# Patient Record
Sex: Female | Born: 1980 | Race: White | Hispanic: No | Marital: Married | Smoking: Current some day smoker
Health system: Southern US, Community
[De-identification: ages and names within clinical notes are randomized; demographics above are authoritative.]

## PROBLEM LIST (undated history)

## (undated) DIAGNOSIS — F32A Depression, unspecified: Secondary | ICD-10-CM

## (undated) DIAGNOSIS — F329 Major depressive disorder, single episode, unspecified: Secondary | ICD-10-CM

## (undated) HISTORY — DX: Depression, unspecified: F32.A

---
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
## 2003-09-06 ENCOUNTER — Emergency Department (HOSPITAL_COMMUNITY): Admission: AD | Admit: 2003-09-06 | Discharge: 2003-09-06 | Payer: Self-pay | Admitting: Family Medicine

## 2005-03-25 ENCOUNTER — Emergency Department: Payer: Self-pay | Admitting: Emergency Medicine

## 2006-09-13 ENCOUNTER — Emergency Department: Payer: Self-pay | Admitting: Emergency Medicine

## 2007-02-14 ENCOUNTER — Emergency Department: Payer: Self-pay | Admitting: Emergency Medicine

## 2007-07-14 ENCOUNTER — Emergency Department: Payer: Self-pay | Admitting: Emergency Medicine

## 2011-12-22 ENCOUNTER — Emergency Department: Payer: Self-pay | Admitting: *Deleted

## 2011-12-22 LAB — CBC WITH DIFFERENTIAL/PLATELET
Basophil #: 0 10*3/uL (ref 0.0–0.1)
Eosinophil %: 1.3 %
HCT: 38.5 % (ref 35.0–47.0)
HGB: 12.5 g/dL (ref 12.0–16.0)
Lymphocyte #: 2.4 10*3/uL (ref 1.0–3.6)
MCH: 30.5 pg (ref 26.0–34.0)
MCHC: 32.6 g/dL (ref 32.0–36.0)
MCV: 94 fL (ref 80–100)
Monocyte #: 0.4 x10 3/mm (ref 0.2–0.9)
Monocyte %: 5.5 %
Neutrophil %: 59 %
RBC: 4.11 10*6/uL (ref 3.80–5.20)
RDW: 12.4 % (ref 11.5–14.5)

## 2011-12-22 LAB — BASIC METABOLIC PANEL
Anion Gap: 9 (ref 7–16)
Chloride: 103 mmol/L (ref 98–107)
Co2: 27 mmol/L (ref 21–32)
EGFR (African American): >60
EGFR (Non-African Amer.): >60
Glucose: 104 mg/dL — ABNORMAL HIGH (ref 65–99)
Osmolality: 276 (ref 275–301)
Potassium: 3.6 mmol/L (ref 3.5–5.1)

## 2011-12-22 LAB — CK TOTAL AND CKMB (NOT AT ARMC)
CK, Total: 108 U/L (ref 21–215)
CK-MB: <0.5 ng/mL — ABNORMAL LOW (ref 0.5–3.6)

## 2011-12-22 LAB — TROPONIN I: Troponin-I: <0.02 ng/mL

## 2019-01-28 ENCOUNTER — Encounter: Payer: Self-pay | Admitting: Family Medicine

## 2019-01-28 ENCOUNTER — Other Ambulatory Visit: Payer: Self-pay

## 2019-01-28 ENCOUNTER — Ambulatory Visit (INDEPENDENT_AMBULATORY_CARE_PROVIDER_SITE_OTHER): Payer: Self-pay | Admitting: Family Medicine

## 2019-01-28 VITALS — BP 107/76 | HR 58

## 2019-01-28 DIAGNOSIS — R635 Abnormal weight gain: Secondary | ICD-10-CM

## 2019-01-28 DIAGNOSIS — F330 Major depressive disorder, recurrent, mild: Secondary | ICD-10-CM

## 2019-01-28 DIAGNOSIS — Z7689 Persons encountering health services in other specified circumstances: Secondary | ICD-10-CM

## 2019-01-28 MED ORDER — BUPROPION HCL ER (XL) 150 MG PO TB24: 150.0000 mg | ORAL_TABLET | Freq: Every day | ORAL | 0 refills | Status: DC

## 2019-01-28 MED ORDER — NORGESTREL-ETHINYL ESTRADIOL 0.3-30 MG-MCG PO TABS: 1.0000 | ORAL_TABLET | Freq: Every day | ORAL | 11 refills | Status: DC

## 2019-01-28 NOTE — Progress Notes (Signed)
BP 107/76 Comment: pt reported- virtual visit  Pulse (!) 58 Comment: pt reported- virtual visit   Subjective:    Patient ID: Tammy Harper, female    DOB: [birth year], 38 y.o.   MRN: [id number]  HPI: Tammy Harper is a 38 y.o. female  Chief Complaint  Patient presents with  . Establish Care    pt has records to bring   . Depression    pt states her medication has made her gain weight    . This visit was completed via WebEx due to the restrictions of the COVID-19 pandemic. All issues as above were discussed and addressed. Physical exam was done as above through visual confirmation on WebEx. If it was felt that the patient should be evaluated in the office, they were directed there. The patient verbally consented to this visit. . Location of the patient: home . Location of the provider: home . Those involved with this call:  . Provider: Merrie Roof, PA-C . CMA: Yvonna Alanis, [place] . Front Desk/Registration: Jill Side  . Time spent on call: 20 minutes with patient face to face via video conference. More than 50% of this time was spent in counseling and coordination of care. 10 minutes total spent in review of patient's record and preparation of their chart. I verified patient identity using two factors (patient name and date of birth). Patient consents verbally to being seen via telemedicine visit today.   Here today to establish care.   Concerned about weight gain. Has typically been in the 130 lb range, now in the mid 150s since getting on the celexa about 2 years ago. Feels an increased appetite since starting the medication. Sometimes gets irritable with family members but otherwise feels it works ok for her. Denies SI/HI, severe mood swings, sleep issues.   Only other medication she takes is birth control pills, which she states she does well on. Needing a refill.   Depression screen [facility] 2/9 01/28/2019  Decreased Interest 0  Down, Depressed, Hopeless 0  PHQ -  2 Score 0  Altered sleeping 0  Tired, decreased energy 0  Change in appetite 0  Feeling bad or failure about yourself  0  Trouble concentrating 0  Moving slowly or fidgety/restless 0  Suicidal thoughts 0  PHQ-9 Score 0  Difficult doing work/chores Not difficult at all   GAD 7 : Generalized Anxiety Score 01/28/2019  Nervous, Anxious, on Edge 0  Control/stop worrying 0  Worry too much - different things 0  Trouble relaxing 0  Restless 0  Easily annoyed or irritable 0  Afraid - awful might happen 0  Total GAD 7 Score 0  Anxiety Difficulty Not difficult at all     Relevant past medical, surgical, family and social history reviewed and updated as indicated. Interim medical history since our last visit reviewed. Allergies and medications reviewed and updated.  Review of Systems  Per HPI unless specifically indicated above     Objective:    BP 107/76 Comment: pt reported- virtual visit  Pulse (!) 58 Comment: pt reported- virtual visit  Wt Readings from Last 3 Encounters:  No data found for Wt    Physical Exam Vitals signs and nursing note reviewed.  Constitutional:      General: She is not in acute distress.    Appearance: Normal appearance.  HENT:     Head: Atraumatic.     Right Ear: External ear normal.     Left Ear: External ear normal.  Nose: Nose normal. No congestion.     Mouth/Throat:     Mouth: Mucous membranes are moist.     Pharynx: Oropharynx is clear. No posterior oropharyngeal erythema.  Eyes:     Extraocular Movements: Extraocular movements intact.     Conjunctiva/sclera: Conjunctivae normal.  Neck:     Musculoskeletal: Normal range of motion.  Cardiovascular:     Comments: Unable to assess via virtual visit Pulmonary:     Effort: Pulmonary effort is normal. No respiratory distress.  Musculoskeletal: Normal range of motion.  Skin:    General: Skin is dry.     Findings: No erythema.  Neurological:     Mental Status: She is alert and oriented  to person, place, and time.  Psychiatric:        Mood and Affect: Mood normal.        Thought Content: Thought content normal.        Judgment: Judgment normal.     No results found for this or any previous visit.    Assessment & Plan:   Problem List Items Addressed This Visit      Other   Depression - Primary    Will switch from celexa to wellbutrin due to appetite increase/weight gain with celexa. Recheck in 1 month.       Relevant Medications   buPROPion (WELLBUTRIN XL) 150 MG 24 hr tablet    Other Visit Diagnoses    Encounter to establish care       Weight gain       Will switch to wellbutrin for depression and work hard on lifestyle modifications. Monitor for benefit, recheck in 1 month       Follow up plan: Return in about 4 weeks (around 02/25/2019) for CPE, mood f/u.

## 2019-02-02 DIAGNOSIS — F32A Depression, unspecified: Secondary | ICD-10-CM | POA: Insufficient documentation

## 2019-02-02 DIAGNOSIS — F329 Major depressive disorder, single episode, unspecified: Secondary | ICD-10-CM | POA: Insufficient documentation

## 2019-02-02 NOTE — Assessment & Plan Note (Signed)
Will switch from celexa to wellbutrin due to appetite increase/weight gain with celexa. Recheck in 1 month.

## 2019-02-18 ENCOUNTER — Telehealth: Payer: Self-pay | Admitting: Nurse Practitioner

## 2019-02-18 NOTE — Telephone Encounter (Signed)
Not patient of this provider.  Appears to have seen Apolonio Schneiders 01/08/19.  Thanks.

## 2019-02-18 NOTE — Telephone Encounter (Signed)
Copied from [place] [phone]. Topic: Quick [employer] and exercise psychologist Patient (Clinic Use ONLY) >> Feb 03, 2019  9:45 AM Amada Kingfisher, CMA wrote: Reason for CRM: Called pt to schedule CPE/Mood F/u for 1 month out. >> Feb 18, 2019 12:47 PM Amada Kingfisher, CMA wrote: Unable to leave vm due to full mailbox. Will generate letter and mail. Will route to provider as FYI.  >> Feb 16, 2019 11:21 AM Amada Kingfisher, CMA wrote: Left message on machine for pt to return call to the office.  >> Feb 04, 2019 10:16 AM Gerda Diss A, CMA wrote: VM box was full unable to leave vm.

## 2019-03-03 ENCOUNTER — Telehealth: Payer: Self-pay | Admitting: Family Medicine

## 2019-03-03 MED ORDER — BUPROPION HCL ER (XL) 150 MG PO TB24: 150.0000 mg | ORAL_TABLET | Freq: Every day | ORAL | 0 refills | Status: DC

## 2019-03-03 NOTE — Telephone Encounter (Signed)
Pt.notified

## 2019-03-03 NOTE — Telephone Encounter (Signed)
Pt called in to get a refill on medications. Scheduled virtual appt for tomorrow. Please advise.

## 2019-03-03 NOTE — Telephone Encounter (Signed)
Will refill to bridge and discuss at upcoming OV

## 2019-03-04 ENCOUNTER — Other Ambulatory Visit: Payer: Self-pay

## 2019-03-04 ENCOUNTER — Encounter: Payer: Self-pay | Admitting: Family Medicine

## 2019-03-04 ENCOUNTER — Ambulatory Visit (INDEPENDENT_AMBULATORY_CARE_PROVIDER_SITE_OTHER): Payer: Self-pay | Admitting: Family Medicine

## 2019-03-04 VITALS — Ht 64.0 in | Wt 152.0 lb

## 2019-03-04 DIAGNOSIS — F330 Major depressive disorder, recurrent, mild: Secondary | ICD-10-CM

## 2019-03-04 MED ORDER — CITALOPRAM HYDROBROMIDE 10 MG PO TABS: 10.0000 mg | ORAL_TABLET | Freq: Every day | ORAL | 1 refills | Status: DC

## 2019-03-04 MED ORDER — BUPROPION HCL ER (XL) 150 MG PO TB24: 150.0000 mg | ORAL_TABLET | Freq: Every day | ORAL | 1 refills | Status: DC

## 2019-03-04 NOTE — Progress Notes (Signed)
Ht 5\' 4"  (1.626 m)   Wt 152 lb (68.9 kg)   BMI 26.09 kg/m    Subjective:    Patient ID: Tammy Harper, female    DOB: [birth year], 38 y.o.   MRN: [id number]  HPI: Tammy Harper is a 38 y.o. female  Chief Complaint  Patient presents with  . Depression    wellbutrin refill    . This visit was completed via WebEx due to the restrictions of the COVID-19 pandemic. All issues as above were discussed and addressed. Physical exam was done as above through visual confirmation on WebEx. If it was felt that the patient should be evaluated in the office, they were directed there. The patient verbally consented to this visit. . Location of the patient: home . Location of the provider: work . Those involved with this call:  . Provider: Merrie Roof, PA-C . CMA: Lesle Chris, [place] . Front Desk/Registration: Jill Side  . Time spent on call: 15 minutes with patient face to face via video conference. More than 50% of this time was spent in counseling and coordination of care. 5 minutes total spent in review of patient's record and preparation of their chart. I verified patient identity using two factors (patient name and date of birth). Patient consents verbally to being seen via telemedicine visit today.   Here today for 1 month mood f/u after starting wellbutrin. Tried coming off the celexa when she switched over to the wellbutrin but felt like she needed to stay on it to some degree given to help with moods. She is currently taking half tab of celexa and the wellbutrin together. Feels like she's more energetic than with the plain celexa and that her moods are much improved on this regimen. Denies SI/HI, sleep, appetite issues.   Depression screen [facility] 2/9 03/04/2019 01/28/2019  Decreased Interest 0 0  Down, Depressed, Hopeless 0 0  PHQ - 2 Score 0 0  Altered sleeping 0 0  Tired, decreased energy 0 0  Change in appetite 0 0  Feeling bad or failure about yourself  0 0  Trouble  concentrating 0 0  Moving slowly or fidgety/restless 0 0  Suicidal thoughts 0 0  PHQ-9 Score 0 0  Difficult doing work/chores - Not difficult at all   GAD 7 : Generalized Anxiety Score 01/28/2019  Nervous, Anxious, on Edge 0  Control/stop worrying 0  Worry too much - different things 0  Trouble relaxing 0  Restless 0  Easily annoyed or irritable 0  Afraid - awful might happen 0  Total GAD 7 Score 0  Anxiety Difficulty Not difficult at all   Relevant past medical, surgical, family and social history reviewed and updated as indicated. Interim medical history since our last visit reviewed. Allergies and medications reviewed and updated.  Review of Systems  Per HPI unless specifically indicated above     Objective:    Ht 5\' 4"  (1.626 m)   Wt 152 lb (68.9 kg)   BMI 26.09 kg/m   Wt Readings from Last 3 Encounters:  03/04/19 152 lb (68.9 kg)    Physical Exam Vitals signs and nursing note reviewed.  Constitutional:      General: She is not in acute distress.    Appearance: Normal appearance.  HENT:     Head: Atraumatic.     Right Ear: External ear normal.     Left Ear: External ear normal.     Nose: Nose normal. No congestion.     Mouth/Throat:  Mouth: Mucous membranes are moist.     Pharynx: Oropharynx is clear. No posterior oropharyngeal erythema.  Eyes:     Extraocular Movements: Extraocular movements intact.     Conjunctiva/sclera: Conjunctivae normal.  Neck:     Musculoskeletal: Normal range of motion.  Cardiovascular:     Comments: Unable to assess via virtual visit Pulmonary:     Effort: Pulmonary effort is normal. No respiratory distress.  Musculoskeletal: Normal range of motion.  Skin:    General: Skin is dry.     Findings: No erythema.  Neurological:     Mental Status: She is alert and oriented to person, place, and time.  Psychiatric:        Mood and Affect: Mood normal.        Thought Content: Thought content normal.        Judgment: Judgment  normal.     No results found for this or any previous visit.    Assessment & Plan:   Problem List Items Addressed This Visit      Other   Depression - Primary    Stable and under good control on half tab celexa and wellbutrin. Continue current regimen      Relevant Medications   buPROPion (WELLBUTRIN XL) 150 MG 24 hr tablet   citalopram (CELEXA) 10 MG tablet       Follow up plan: Return in about 6 months (around 09/04/2019) for CPE.

## 2019-03-09 NOTE — Assessment & Plan Note (Signed)
Stable and under good control on half tab celexa and wellbutrin. Continue current regimen

## 2019-04-10 ENCOUNTER — Encounter: Payer: Self-pay | Admitting: Family Medicine

## 2019-08-25 ENCOUNTER — Telehealth: Payer: Self-pay | Admitting: Family Medicine

## 2019-08-25 NOTE — Telephone Encounter (Signed)
Needs appt  Copied from [id number] [id number]. Topic: General - Other >> Aug 25, 2019  1:07 PM Rainey Pines A wrote: Patient would like to speak with nurse in regards to her current birth control. Patient did not wish to schedule appt but would like to know if birth control can be switched due to mood swings. Please advise . >> Aug 25, 2019  3:03 PM Georgina Peer, CMA wrote: Would the patient need an appointment to change medication?

## 2019-09-01 ENCOUNTER — Ambulatory Visit (INDEPENDENT_AMBULATORY_CARE_PROVIDER_SITE_OTHER): Payer: Self-pay | Admitting: Family Medicine

## 2019-09-01 ENCOUNTER — Other Ambulatory Visit: Payer: Self-pay

## 2019-09-01 ENCOUNTER — Encounter: Payer: Self-pay | Admitting: Family Medicine

## 2019-09-01 VITALS — Ht 64.0 in | Wt 152.0 lb

## 2019-09-01 DIAGNOSIS — F330 Major depressive disorder, recurrent, mild: Secondary | ICD-10-CM

## 2019-09-01 DIAGNOSIS — Z3009 Encounter for other general counseling and advice on contraception: Secondary | ICD-10-CM

## 2019-09-01 MED ORDER — DROSPIRENONE-ETHINYL ESTRADIOL 3-0.03 MG PO TABS: 1.0000 | ORAL_TABLET | Freq: Every day | ORAL | 11 refills | Status: DC

## 2019-09-01 MED ORDER — SERTRALINE HCL 50 MG PO TABS: 50.0000 mg | ORAL_TABLET | Freq: Every day | ORAL | 0 refills | Status: DC

## 2019-09-01 NOTE — Assessment & Plan Note (Signed)
Will d/c celexa and start zoloft in addition to wellbutrin. May need to come off the wellbutrin if no improvement in agitation, but for now will continue because she feels it helps with her energy and motivation.

## 2019-09-01 NOTE — Progress Notes (Signed)
Ht 5\' 4"  (1.626 m)   Wt 152 lb (68.9 kg)   BMI 26.09 kg/m    Subjective:    Patient ID: Tammy Harper, female    DOB: [birth year], 38 y.o.   MRN: [id number]  HPI: Tammy Harper is a 38 y.o. female  Chief Complaint  Patient presents with  . Contraception    pt states that pharmacy swiched her previous birth control pills for cryselle and pt does not like that  . Depression    wellbutrin and citalopram refill    . This visit was completed via WebEx due to the restrictions of the COVID-19 pandemic. All issues as above were discussed and addressed. Physical exam was done as above through visual confirmation on WebEx. If it was felt that the patient should be evaluated in the office, they were directed there. The patient verbally consented to this visit. . Location of the patient: home . Location of the provider: home . Those involved with this call:  . Provider: Merrie Roof, PA-C . CMA: Lesle Chris, [place] . Front Desk/Registration: Jill Side  . Time spent on call: 25 minutes with patient face to face via video conference. More than 50% of this time was spent in counseling and coordination of care. 5 minutes total spent in review of patient's record and preparation of their chart. I verified patient identity using two factors (patient name and date of birth). Patient consents verbally to being seen via telemedicine visit today.   Was given chryselle instead of the brand of OCP she usually takes and does not feel well on it at all. Was told the previous medication was no longer available. Wanting to try yasmin instead.   Feeling very irritable, on edge pretty consistently the past year or so. Currently on celexa and wellbutrin regimen which she feels overall does a fairly good job and gives her energy and motivation but she does not like the breakthrough agitation she feels often. Denies mood swings, SI/HI, sleep or appetite concerns.   Depression screen [facility] 2/9 09/01/2019  03/04/2019 01/28/2019  Decreased Interest 0 0 0  Down, Depressed, Hopeless 0 0 0  PHQ - 2 Score 0 0 0  Altered sleeping 2 0 0  Tired, decreased energy 0 0 0  Change in appetite 2 0 0  Feeling bad or failure about yourself  0 0 0  Trouble concentrating 0 0 0  Moving slowly or fidgety/restless 0 0 0  Suicidal thoughts 0 0 0  PHQ-9 Score 4 0 0  Difficult doing work/chores Not difficult at all - Not difficult at all   GAD 7 : Generalized Anxiety Score 09/01/2019 01/28/2019  Nervous, Anxious, on Edge 2 0  Control/stop worrying 1 0  Worry too much - different things 1 0  Trouble relaxing 1 0  Restless 0 0  Easily annoyed or irritable 2 0  Afraid - awful might happen 0 0  Total GAD 7 Score 7 0  Anxiety Difficulty Somewhat difficult Not difficult at all     Relevant past medical, surgical, family and social history reviewed and updated as indicated. Interim medical history since our last visit reviewed. Allergies and medications reviewed and updated.  Review of Systems  Per HPI unless specifically indicated above     Objective:    Ht 5\' 4"  (1.626 m)   Wt 152 lb (68.9 kg)   BMI 26.09 kg/m   Wt Readings from Last 3 Encounters:  09/01/19 152 lb (68.9 kg)  03/04/19 152  lb (68.9 kg)    Physical Exam Vitals and nursing note reviewed.  Constitutional:      General: She is not in acute distress.    Appearance: Normal appearance.  HENT:     Head: Atraumatic.     Right Ear: External ear normal.     Left Ear: External ear normal.     Nose: Nose normal. No congestion.     Mouth/Throat:     Mouth: Mucous membranes are moist.     Pharynx: Oropharynx is clear. No posterior oropharyngeal erythema.  Eyes:     Extraocular Movements: Extraocular movements intact.     Conjunctiva/sclera: Conjunctivae normal.  Cardiovascular:     Comments: Unable to assess via virtual visit Pulmonary:     Effort: Pulmonary effort is normal. No respiratory distress.  Musculoskeletal:        General:  Normal range of motion.     Cervical back: Normal range of motion.  Skin:    General: Skin is dry.     Findings: No erythema.  Neurological:     Mental Status: She is alert and oriented to person, place, and time.  Psychiatric:        Mood and Affect: Mood normal.        Thought Content: Thought content normal.        Judgment: Judgment normal.     No results found for this or any previous visit.    Assessment & Plan:   Problem List Items Addressed This Visit      Other   Depression - Primary    Will d/c celexa and start zoloft in addition to wellbutrin. May need to come off the wellbutrin if no improvement in agitation, but for now will continue because she feels it helps with her energy and motivation.       Relevant Medications   sertraline (ZOLOFT) 50 MG tablet    Other Visit Diagnoses    Encounter for other general counseling or advice on contraception       D/c new pack given at pharmacy d/t side effects, trial yasmin. Adjustment period and sxs discussed      25 minutes today spent in direct care and counseling with patient.   Follow up plan: Return in about 4 weeks (around 09/29/2019) for Mood f/u.

## 2019-09-07 ENCOUNTER — Other Ambulatory Visit: Payer: Self-pay | Admitting: Family Medicine

## 2019-09-08 ENCOUNTER — Other Ambulatory Visit: Payer: Self-pay | Admitting: Family Medicine

## 2019-09-08 MED ORDER — BUPROPION HCL ER (XL) 150 MG PO TB24: 150.0000 mg | ORAL_TABLET | Freq: Every day | ORAL | 1 refills | Status: DC

## 2019-09-08 NOTE — Telephone Encounter (Signed)
buPROPion (WELLBUTRIN XL) 150 MG 24 hr tablet      Patient is requesting a refill.    Pharmacy:  [facility] [place], [place] AT [facility] [address] [facility] Phone:  [phone]  Fax:  [phone]

## 2019-09-08 NOTE — Telephone Encounter (Signed)
Requested Prescriptions  Pending Prescriptions Disp Refills  . buPROPion (WELLBUTRIN XL) 150 MG 24 hr tablet 90 tablet 1    Sig: Take 1 tablet (150 mg total) by mouth daily.     Psychiatry: Antidepressants - bupropion Passed - 09/08/2019  9:44 AM      Passed - Completed PHQ-2 or PHQ-9 in the last 360 days.      Passed - Last BP in normal range    BP Readings from Last 1 Encounters:  01/28/19 107/76         Passed - Valid encounter within last 6 months    Recent Outpatient Visits          1 week ago Mild episode of recurrent major depressive disorder [facility])   [facility] [place], [place]   6 months ago Mild episode of recurrent major depressive disorder [facility])   [place], [place], [place]   7 months ago Mild episode of recurrent major depressive disorder [facility])   [facility] [place], [place]      Future Appointments            In 3 weeks Orene Desanctis, Lilia Argue, PA-C [facility]

## 2019-09-10 ENCOUNTER — Encounter: Payer: Self-pay | Admitting: Family Medicine

## 2019-09-23 ENCOUNTER — Encounter: Payer: Self-pay | Admitting: Family Medicine

## 2019-09-23 ENCOUNTER — Ambulatory Visit (INDEPENDENT_AMBULATORY_CARE_PROVIDER_SITE_OTHER): Payer: Self-pay | Admitting: Family Medicine

## 2019-09-23 DIAGNOSIS — R319 Hematuria, unspecified: Secondary | ICD-10-CM

## 2019-09-23 DIAGNOSIS — F330 Major depressive disorder, recurrent, mild: Secondary | ICD-10-CM

## 2019-09-23 DIAGNOSIS — R109 Unspecified abdominal pain: Secondary | ICD-10-CM

## 2019-09-23 NOTE — Progress Notes (Signed)
LMP 09/12/2019 (Approximate)    Subjective:    Patient ID: Tammy Harper, female    DOB: [birth year], 39 y.o.   MRN: [id number]  HPI: Tammy Harper is a 39 y.o. female  Chief Complaint  Patient presents with  . Bloated    pt states she has been very bloated and had abdominal pain on the left side. States she went to [facility] care yesterday where she was diagnosed with IBS. Also had blood in urine, negative pregnancy test per patient. Wants providers opinion on everything.     . This visit was completed via WebEx due to the restrictions of the COVID-19 pandemic. All issues as above were discussed and addressed. Physical exam was done as above through visual confirmation on WebEx. If it was felt that the patient should be evaluated in the office, they were directed there. The patient verbally consented to this visit. . Location of the patient: home . Location of the provider: work . Those involved with this call:  . Provider: Merrie Roof, PA-C . CMA: Lesle Chris, [place] . Front Desk/Registration: Jill Side  . Time spent on call: 25 minutes with patient face to face via video conference. More than 50% of this time was spent in counseling and coordination of care. 5 minutes total spent in review of patient's record and preparation of their chart. I verified patient identity using two factors (patient name and date of birth). Patient consents verbally to being seen via telemedicine visit today.   About 2 weeks of left lower abdominal pain, bloating, and cramping that gets worse after eating. Went to UC for this and was told she likely had IBS. Per patient urine pregnancy was negative, U/A without infection but did show some hematuria. Was not started on anything for sxs. Only new medication change was zoloft, and no recent travel, new foods or exposures, sick contacts. Had some celexa leftover at home so restarted that the other day in place of the zoloft to see if the medication was  causing issues. Sxs are mild at this time. Denies fevers, chills, melena.   Relevant past medical, surgical, family and social history reviewed and updated as indicated. Interim medical history since our last visit reviewed. Allergies and medications reviewed and updated.  Review of Systems  Per HPI unless specifically indicated above     Objective:    LMP 09/12/2019 (Approximate)   Wt Readings from Last 3 Encounters:  09/01/19 152 lb (68.9 kg)  03/04/19 152 lb (68.9 kg)    Physical Exam Vitals and nursing note reviewed.  Constitutional:      General: She is not in acute distress.    Appearance: Normal appearance.  HENT:     Head: Atraumatic.     Right Ear: External ear normal.     Left Ear: External ear normal.     Nose: Nose normal. No congestion.     Mouth/Throat:     Mouth: Mucous membranes are moist.     Pharynx: Oropharynx is clear. No posterior oropharyngeal erythema.  Eyes:     Extraocular Movements: Extraocular movements intact.     Conjunctiva/sclera: Conjunctivae normal.  Cardiovascular:     Comments: Unable to assess via virtual visit Pulmonary:     Effort: Pulmonary effort is normal. No respiratory distress.  Abdominal:     Comments: Unable to perform abdominal exam due to virtual nature of visit  Musculoskeletal:        General: Normal range of motion.  Cervical back: Normal range of motion.  Skin:    General: Skin is dry.     Findings: No erythema.  Neurological:     Mental Status: She is alert and oriented to person, place, and time.  Psychiatric:        Mood and Affect: Mood normal.        Thought Content: Thought content normal.        Judgment: Judgment normal.    No results found for this or any previous visit.    Assessment & Plan:   Problem List Items Addressed This Visit      Other   Depression    Continue d/c of zoloft, she wishes to stay on the celexa she had at home for now and we will recheck in 1 month for tolerance.         Other Visit Diagnoses    Abdominal cramping    -  Primary   Unclear etiology, IBS vs acute illness vs medication side effect. BRAT diet, d/c zoloft, probiotics, fiber. F/u for in person evaluation if persisting or worse   Hematuria, unspecified type       Will recheck U/A and f/u based on those results   Relevant Orders   UA/M w/rflx Culture, Routine       Follow up plan: Return in about 4 weeks (around 10/21/2019) for anxiety f/u.

## 2019-09-28 ENCOUNTER — Other Ambulatory Visit: Payer: Self-pay | Admitting: Family Medicine

## 2019-09-29 ENCOUNTER — Telehealth: Payer: Self-pay | Admitting: Family Medicine

## 2019-09-29 NOTE — Telephone Encounter (Signed)
Copied from [place]. Topic: General - Other >> Sep 29, 2019  8:41 AM Keene Breath wrote: Reason for CRM: Patient would like to start back taking Celxa which was prescribed by another doctor.  Please advise and call patient to discuss.  CB# [phone]

## 2019-09-29 NOTE — Telephone Encounter (Signed)
Done

## 2019-09-30 ENCOUNTER — Ambulatory Visit: Payer: Self-pay | Admitting: Family Medicine

## 2019-09-30 ENCOUNTER — Telehealth (INDEPENDENT_AMBULATORY_CARE_PROVIDER_SITE_OTHER): Payer: Self-pay | Admitting: Family Medicine

## 2019-09-30 ENCOUNTER — Encounter: Payer: Self-pay | Admitting: Family Medicine

## 2019-09-30 DIAGNOSIS — F330 Major depressive disorder, recurrent, mild: Secondary | ICD-10-CM

## 2019-09-30 MED ORDER — ESCITALOPRAM OXALATE 5 MG PO TABS: 5.0000 mg | ORAL_TABLET | Freq: Every day | ORAL | 2 refills | Status: DC

## 2019-09-30 NOTE — Assessment & Plan Note (Signed)
Did not tolerate the zoloft, felt irritable on the celexa. Very concerned about feeling drugged and not having energy- will try low dose lexapro and recheck 1 month. Call with any concerns.

## 2019-09-30 NOTE — Progress Notes (Signed)
LMP 09/12/2019 (Approximate)    Subjective:    Patient ID: Tammy Harper, female    DOB: [birth year], 39 y.o.   MRN: [id number]  HPI: [name] is a 39 y.o. female  Chief Complaint  Patient presents with  . Anxiety   DEPRESSION- had a little irritability with the celexa but did not do well with the zoloft  Mood status: exacerbated Satisfied with current treatment?: no Symptom severity: mild  Duration of current treatment : chronic Side effects: yes- irritability Medication compliance: fair compliance Psychotherapy/counseling: no  Previous psychiatric medications: celexa and zoloft ans wellbutrin Depressed mood: yes Anxious mood: yes Anhedonia: no Significant weight loss or gain: no Insomnia: no  Fatigue: no Feelings of worthlessness or guilt: no Impaired concentration/indecisiveness: no Suicidal ideations: no Hopelessness: no Crying spells: no Depression screen [facility] 2/9 09/30/2019 09/01/2019 03/04/2019 01/28/2019  Decreased Interest 0 0 0 0  Down, Depressed, Hopeless 0 0 0 0  PHQ - 2 Score 0 0 0 0  Altered sleeping 2 2 0 0  Tired, decreased energy 0 0 0 0  Change in appetite 1 2 0 0  Feeling bad or failure about yourself  0 0 0 0  Trouble concentrating 0 0 0 0  Moving slowly or fidgety/restless 0 0 0 0  Suicidal thoughts 0 0 0 0  PHQ-9 Score 3 4 0 0  Difficult doing work/chores Not difficult at all Not difficult at all - Not difficult at all   GAD 7 : Generalized Anxiety Score 09/30/2019 09/01/2019 01/28/2019  Nervous, Anxious, on Edge 1 2 0  Control/stop worrying 0 1 0  Worry too much - different things 1 1 0  Trouble relaxing 0 1 0  Restless 0 0 0  Easily annoyed or irritable 1 2 0  Afraid - awful might happen 0 0 0  Total GAD 7 Score 3 7 0  Anxiety Difficulty Not difficult at all Somewhat difficult Not difficult at all    Relevant past medical, surgical, family and social history reviewed and updated as indicated. Interim medical history since  our last visit reviewed. Allergies and medications reviewed and updated.  Review of Systems  Constitutional: Negative.   Respiratory: Negative.   Cardiovascular: Negative.   Musculoskeletal: Negative.   Psychiatric/Behavioral: Positive for dysphoric mood. Negative for agitation, behavioral problems, confusion, decreased concentration, hallucinations, self-injury, sleep disturbance and suicidal ideas. The patient is nervous/anxious. The patient is not hyperactive.     Per HPI unless specifically indicated above     Objective:    LMP 09/12/2019 (Approximate)   Wt Readings from Last 3 Encounters:  09/01/19 152 lb (68.9 kg)  03/04/19 152 lb (68.9 kg)    Physical Exam Vitals and nursing note reviewed.  Constitutional:      General: She is not in acute distress.    Appearance: Normal appearance. She is not ill-appearing, toxic-appearing or diaphoretic.  HENT:     Head: Normocephalic and atraumatic.     Right Ear: External ear normal.     Left Ear: External ear normal.     Nose: Nose normal.     Mouth/Throat:     Mouth: Mucous membranes are moist.     Pharynx: Oropharynx is clear.  Eyes:     General: No scleral icterus.       Right eye: No discharge.        Left eye: No discharge.     Conjunctiva/sclera: Conjunctivae normal.     Pupils: Pupils are equal, round, and  reactive to light.  Pulmonary:     Effort: Pulmonary effort is normal. No respiratory distress.     Comments: Speaking in full sentences Musculoskeletal:        General: Normal range of motion.     Cervical back: Normal range of motion.  Skin:    Coloration: Skin is not jaundiced or pale.     Findings: No bruising, erythema, lesion or rash.  Neurological:     Mental Status: She is alert and oriented to person, place, and time. Mental status is at baseline.  Psychiatric:        Mood and Affect: Mood normal.        Behavior: Behavior normal.        Thought Content: Thought content normal.        Judgment:  Judgment normal.     No results found for this or any previous visit.    Assessment & Plan:   Problem List Items Addressed This Visit      Other   Depression    Did not tolerate the zoloft, felt irritable on the celexa. Very concerned about feeling drugged and not having energy- will try low dose lexapro and recheck 1 month. Call with any concerns.       Relevant Medications   escitalopram (LEXAPRO) 5 MG tablet       Follow up plan: Return in about 4 weeks (around 10/28/2019) for follow up mood.    . This visit was completed via mychart due to the restrictions of the COVID-19 pandemic. All issues as above were discussed and addressed. Physical exam was done as above through visual confirmation on mychart. If it was felt that the patient should be evaluated in the office, they were directed there. The patient verbally consented to this visit. . Location of the patient: home . Location of the provider: home . Those involved with this call:  . Provider: Park Liter, DO . CMA: Tiffany Reel, CMA . Front Desk/Registration: Don Perking  . Time spent on call: 15 minutes with patient face to face via video conference. More than 50% of this time was spent in counseling and coordination of care. 23 minutes total spent in review of patient's record and preparation of their chart.

## 2019-10-02 NOTE — Assessment & Plan Note (Signed)
Continue d/c of zoloft, she wishes to stay on the celexa she had at home for now and we will recheck in 1 month for tolerance.

## 2019-10-28 ENCOUNTER — Telehealth: Payer: Self-pay | Admitting: Family Medicine

## 2019-10-28 ENCOUNTER — Ambulatory Visit: Payer: Self-pay | Admitting: Family Medicine

## 2020-01-05 ENCOUNTER — Telehealth (INDEPENDENT_AMBULATORY_CARE_PROVIDER_SITE_OTHER): Payer: Self-pay | Admitting: Family Medicine

## 2020-01-05 ENCOUNTER — Encounter: Payer: Self-pay | Admitting: Family Medicine

## 2020-01-05 DIAGNOSIS — R635 Abnormal weight gain: Secondary | ICD-10-CM

## 2020-01-05 DIAGNOSIS — F330 Major depressive disorder, recurrent, mild: Secondary | ICD-10-CM

## 2020-01-05 DIAGNOSIS — F329 Major depressive disorder, single episode, unspecified: Secondary | ICD-10-CM

## 2020-01-05 MED ORDER — BUPROPION HCL ER (XL) 150 MG PO TB24: 150.0000 mg | ORAL_TABLET | Freq: Every day | ORAL | 1 refills | Status: DC

## 2020-01-05 MED ORDER — ESCITALOPRAM OXALATE 5 MG PO TABS: 5.0000 mg | ORAL_TABLET | Freq: Every day | ORAL | 0 refills | Status: DC

## 2020-01-05 MED ORDER — TOPIRAMATE 25 MG PO TABS: 25.0000 mg | ORAL_TABLET | Freq: Two times a day (BID) | ORAL | 1 refills | Status: DC

## 2020-01-05 NOTE — Progress Notes (Signed)
There were no vitals taken for this visit.   Subjective:    Patient ID: Tammy Harper, female    DOB: 1981/05/07, 39 y.o.   MRN: [id number]  HPI: Tammy Harper is a 39 y.o. female  Chief Complaint  Patient presents with  . Depression  . Anxiety    . This visit was completed via MyChart due to the restrictions of the COVID-19 pandemic. All issues as above were discussed and addressed. Physical exam was done as above through visual confirmation on MyChart. If it was felt that the patient should be evaluated in the office, they were directed there. The patient verbally consented to this visit. . Location of the patient: home . Location of the provider: home . Those involved with this call:  . Provider: Merrie Roof, PA-C . CMA: Tiffany Reel, CMA . Front Desk/Registration: Jill Side  . Time spent on call: 20 minutes with patient face to face via video conference. More than 50% of this time was spent in counseling and coordination of care. 5 minutes total spent in review of patient's record and preparation of their chart. I verified patient identity using two factors (patient name and date of birth). Patient consents verbally to being seen via telemedicine visit today.   Here today for mood f/u - currently on lexapro and wellbutrin regimen which has been going well. Moods under good control, denies side effects. No SI/HI. Main concern otherwise is her weight. Works out 6 days per week and eats healthy but having trouble losing weight. Has never tried medications for this before.   Depression screen [facility] 2/9 01/05/2020 09/30/2019 09/01/2019  Decreased Interest 0 0 0  Down, Depressed, Hopeless 0 0 0  PHQ - 2 Score 0 0 0  Altered sleeping 2 2 2   Tired, decreased energy 0 0 0  Change in appetite 2 1 2   Feeling bad or failure about yourself  0 0 0  Trouble concentrating 0 0 0  Moving slowly or fidgety/restless 0 0 0  Suicidal thoughts 0 0 0  PHQ-9 Score 4 3 4   Difficult  doing work/chores Not difficult at all Not difficult at all Not difficult at all   GAD 7 : Generalized Anxiety Score 01/05/2020 09/30/2019 09/01/2019 01/28/2019  Nervous, Anxious, on Edge 1 1 2  0  Control/stop worrying 0 0 1 0  Worry too much - different things 0 1 1 0  Trouble relaxing 1 0 1 0  Restless 0 0 0 0  Easily annoyed or irritable 2 1 2  0  Afraid - awful might happen 0 0 0 0  Total GAD 7 Score 4 3 7  0  Anxiety Difficulty Somewhat difficult Not difficult at all Somewhat difficult Not difficult at all   Relevant past medical, surgical, family and social history reviewed and updated as indicated. Interim medical history since our last visit reviewed. Allergies and medications reviewed and updated.  Review of Systems  Per HPI unless specifically indicated above     Objective:    There were no vitals taken for this visit.  Wt Readings from Last 3 Encounters:  09/01/19 152 lb (68.9 kg)  03/04/19 152 lb (68.9 kg)    Physical Exam Vitals and nursing note reviewed.  Constitutional:      General: She is not in acute distress.    Appearance: Normal appearance.  HENT:     Head: Atraumatic.     Right Ear: External ear normal.     Left Ear: External ear normal.  Nose: Nose normal. No congestion.     Mouth/Throat:     Mouth: Mucous membranes are moist.     Pharynx: Oropharynx is clear. No posterior oropharyngeal erythema.  Eyes:     Extraocular Movements: Extraocular movements intact.     Conjunctiva/sclera: Conjunctivae normal.  Cardiovascular:     Comments: Unable to assess via virtual visit Pulmonary:     Effort: Pulmonary effort is normal. No respiratory distress.  Musculoskeletal:        General: Normal range of motion.     Cervical back: Normal range of motion.  Skin:    General: Skin is dry.     Findings: No erythema.  Neurological:     Mental Status: She is alert and oriented to person, place, and time.  Psychiatric:        Mood and Affect: Mood normal.          Thought Content: Thought content normal.        Judgment: Judgment normal.     No results found for this or any previous visit.    Assessment & Plan:   Problem List Items Addressed This Visit      Other   Depression - Primary    Stable and well controlled, continue current regimen      Relevant Medications   escitalopram (LEXAPRO) 5 MG tablet   buPROPion (WELLBUTRIN XL) 150 MG 24 hr tablet    Other Visit Diagnoses    Weight gain       Discussed numerous options, will trial topamax in addition to diet and exercise. Continue to monitor for benefit. Risks and side effects reviewed   Relevant Orders   TSH       Follow up plan: Return in about 6 weeks (around 02/16/2020) for weight check.

## 2020-01-11 NOTE — Assessment & Plan Note (Signed)
Stable and well controlled, continue current regimen 

## 2020-01-14 ENCOUNTER — Other Ambulatory Visit: Payer: Self-pay | Admitting: Family Medicine

## 2020-03-09 ENCOUNTER — Other Ambulatory Visit: Payer: Self-pay | Admitting: Family Medicine

## 2020-03-18 ENCOUNTER — Encounter: Payer: Self-pay | Admitting: Family Medicine

## 2020-03-18 NOTE — Telephone Encounter (Signed)
Needs appt for weight check before we can give refill

## 2020-03-23 ENCOUNTER — Telehealth (INDEPENDENT_AMBULATORY_CARE_PROVIDER_SITE_OTHER): Payer: Self-pay | Admitting: Family Medicine

## 2020-03-23 ENCOUNTER — Encounter: Payer: Self-pay | Admitting: Family Medicine

## 2020-03-23 VITALS — Temp 97.5°F | Wt 148.0 lb

## 2020-03-23 DIAGNOSIS — R635 Abnormal weight gain: Secondary | ICD-10-CM

## 2020-03-23 MED ORDER — TOPIRAMATE 50 MG PO TABS: 50.0000 mg | ORAL_TABLET | Freq: Two times a day (BID) | ORAL | 0 refills | Status: DC

## 2020-03-23 MED ORDER — ESCITALOPRAM OXALATE 5 MG PO TABS: 5.0000 mg | ORAL_TABLET | Freq: Every day | ORAL | 1 refills | Status: DC

## 2020-03-23 NOTE — Progress Notes (Signed)
Temp (!) 97.5 F (36.4 C) (Oral)   Wt 148 lb (67.1 kg)   BMI 25.40 kg/m    Subjective:    Patient ID: Tammy Harper, female    DOB: 22-Jul-1981, 39 y.o.   MRN: [id number]  HPI: Tammy Harper is a 39 y.o. female  Chief Complaint  Patient presents with  . Anxiety  . Depression  . Medication Refill    . This visit was completed via MyChart due to the restrictions of the COVID-19 pandemic. All issues as above were discussed and addressed. Physical exam was done as above through visual confirmation on MyChart. If it was felt that the patient should be evaluated in the office, they were directed there. The patient verbally consented to this visit. . Location of the patient: home . Location of the provider: work . Those involved with this call:  . Provider: Merrie Roof, PA-C . CMA: Lesle Chris, [place] . Front Desk/Registration: Don Perking  . Time spent on call: 15 minutes with patient face to face via video conference. More than 50% of this time was spent in counseling and coordination of care. 5 minutes total spent in review of patient's record and preparation of their chart. I verified patient identity using two factors (patient name and date of birth). Patient consents verbally to being seen via telemedicine visit today.   Here today for weight check after starting topamax regimen in addition to her diet and exercise changes. Has lost about 5 lb so far and consistently kept it off. Some decrease in appetite and notes feeling more successful with the medicine than she's ever felt off of it. Denies side effects or concerns.   Relevant past medical, surgical, family and social history reviewed and updated as indicated. Interim medical history since our last visit reviewed. Allergies and medications reviewed and updated.  Review of Systems  Per HPI unless specifically indicated above     Objective:    Temp (!) 97.5 F (36.4 C) (Oral)   Wt 148 lb (67.1 kg)   BMI  25.40 kg/m   Wt Readings from Last 3 Encounters:  03/23/20 148 lb (67.1 kg)  09/01/19 152 lb (68.9 kg)  03/04/19 152 lb (68.9 kg)    Physical Exam Vitals and nursing note reviewed.  Constitutional:      General: She is not in acute distress.    Appearance: Normal appearance.  HENT:     Head: Atraumatic.     Right Ear: External ear normal.     Left Ear: External ear normal.     Nose: Nose normal. No congestion.     Mouth/Throat:     Mouth: Mucous membranes are moist.     Pharynx: Oropharynx is clear. No posterior oropharyngeal erythema.  Eyes:     Extraocular Movements: Extraocular movements intact.     Conjunctiva/sclera: Conjunctivae normal.  Cardiovascular:     Comments: Unable to assess via virtual visit Pulmonary:     Effort: Pulmonary effort is normal. No respiratory distress.  Musculoskeletal:        General: Normal range of motion.     Cervical back: Normal range of motion.  Skin:    General: Skin is dry.     Findings: No erythema.  Neurological:     Mental Status: She is alert and oriented to person, place, and time.  Psychiatric:        Mood and Affect: Mood normal.        Thought Content: Thought content normal.  Judgment: Judgment normal.     No results found for this or any previous visit.    Assessment & Plan:   Problem List Items Addressed This Visit    None    Visit Diagnoses    Weight gain    -  Primary   Excellent improvement with topamax, increase dose to 50 mg BID and continue diet and exercise. F/u in 3 months for recheck       Follow up plan: Return in about 3 months (around 06/23/2020) for weight check.

## 2020-09-08 ENCOUNTER — Other Ambulatory Visit: Payer: Self-pay | Admitting: Family Medicine

## 2020-09-08 NOTE — Telephone Encounter (Signed)
Previous RL patient. Last seen in July.  

## 2020-09-08 NOTE — Telephone Encounter (Signed)
  Notes to clinic:  Last seen by Roosvelt Maser Review for refill    Requested Prescriptions  Pending Prescriptions Disp Refills   drospirenone-ethinyl estradiol (YASMIN) 3-0.03 MG tablet [Pharmacy Med Name: DROSPIRENONE-ETHINYL 3-0.03MG  T 28S] 28 tablet     Sig: Take 1 tablet by mouth daily.      OB/GYN:  Contraceptives Passed - 09/08/2020  3:17 AM      Passed - Last BP in normal range    BP Readings from Last 1 Encounters:  01/28/19 107/76          Passed - Valid encounter within last 12 months    Recent Outpatient Visits           5 months ago Weight gain   [facility] Roosvelt Maser [place], [place]   8 months ago Mild episode of recurrent major depressive disorder [facility])   [facility] Roosvelt Maser [place], [place]   11 months ago Mild episode of recurrent major depressive disorder [facility])   [facility] [place], [place], [place]   11 months ago Abdominal cramping   [facility] Roosvelt Maser [place], [place]   1 year ago Mild episode of recurrent major depressive disorder [facility])   [facility] Particia [place], [place]

## 2020-09-11 ENCOUNTER — Other Ambulatory Visit: Payer: Self-pay

## 2020-09-12 MED ORDER — TOPIRAMATE 50 MG PO TABS: 50.0000 mg | ORAL_TABLET | Freq: Two times a day (BID) | ORAL | 0 refills | Status: DC

## 2020-09-12 NOTE — Telephone Encounter (Signed)
Needs appt

## 2020-09-12 NOTE — Telephone Encounter (Signed)
Last seen in July, no future appointment scheduled.

## 2020-09-12 NOTE — Telephone Encounter (Signed)
Pt scheduled for apt.

## 2020-09-12 NOTE — Telephone Encounter (Signed)
Pt still has medication until the end of the week pt scheduled for virtual on 09/15/2020 does have smart phone and way to do all vitals at home Requested virtual to minus exposure.IS virtual ok for this apt?

## 2020-09-12 NOTE — Telephone Encounter (Signed)
Bupropion and Topiramate   I didn't see it either it was in the subject line

## 2020-09-12 NOTE — Telephone Encounter (Signed)
Please get patient scheduled, then notify Dr. Lenna Sciara

## 2020-09-15 ENCOUNTER — Encounter: Payer: Self-pay | Admitting: Family Medicine

## 2020-09-15 ENCOUNTER — Telehealth (INDEPENDENT_AMBULATORY_CARE_PROVIDER_SITE_OTHER): Payer: BC Managed Care – PPO | Admitting: Family Medicine

## 2020-09-15 VITALS — BP 133/91 | Temp 96.4°F | Wt 147.4 lb

## 2020-09-15 DIAGNOSIS — F330 Major depressive disorder, recurrent, mild: Secondary | ICD-10-CM | POA: Diagnosis not present

## 2020-09-15 DIAGNOSIS — R635 Abnormal weight gain: Secondary | ICD-10-CM | POA: Diagnosis not present

## 2020-09-15 MED ORDER — BUPROPION HCL ER (XL) 150 MG PO TB24: 150.0000 mg | ORAL_TABLET | Freq: Every day | ORAL | 1 refills | Status: DC

## 2020-09-15 MED ORDER — ESCITALOPRAM OXALATE 5 MG PO TABS: 5.0000 mg | ORAL_TABLET | Freq: Every day | ORAL | 1 refills | Status: DC

## 2020-09-15 MED ORDER — TOPIRAMATE 50 MG PO TABS: 50.0000 mg | ORAL_TABLET | Freq: Two times a day (BID) | ORAL | 1 refills | Status: DC

## 2020-09-15 NOTE — Progress Notes (Signed)
BP (!) 133/91   Temp (!) 96.4 F (35.8 C) (Oral)   Wt 147 lb 6.4 oz (66.9 kg)   LMP 09/06/2020 (Approximate)   BMI 25.30 kg/m    Subjective:    Patient ID: Tammy Harper, female    DOB: [birth year], 40 y.o.   MRN: [id number]  HPI: Tammy Harper is a 40 y.o. female  Chief Complaint  Patient presents with  . Depression  . Obesity   DEPRESSION Mood status: stable Satisfied with current treatment?: yes Symptom severity: mild  Duration of current treatment : chronic Side effects: yes- decreased libido Medication compliance: excellent compliance Psychotherapy/counseling: no  Depressed mood: no Anxious mood: no Anhedonia: no Significant weight loss or gain: yes Insomnia: no  Fatigue: no Feelings of worthlessness or guilt: no Impaired concentration/indecisiveness: no Suicidal ideations: no Hopelessness: no Crying spells: no Depression screen [facility] 2/9 09/15/2020 03/23/2020 01/05/2020 09/30/2019 09/01/2019  Decreased Interest 0 0 0 0 0  Down, Depressed, Hopeless 0 0 0 0 0  PHQ - 2 Score 0 0 0 0 0  Altered sleeping 0 0 2 2 2   Tired, decreased energy 0 0 0 0 0  Change in appetite 0 0 2 1 2   Feeling bad or failure about yourself  0 0 0 0 0  Trouble concentrating 0 0 0 0 0  Moving slowly or fidgety/restless 0 0 0 0 0  Suicidal thoughts 0 0 0 0 0  PHQ-9 Score 0 0 4 3 4   Difficult doing work/chores Not difficult at all - Not difficult at all Not difficult at all Not difficult at all   OBESITY Duration: chronic Previous attempts at weight loss: yes Complications of obesity: depression Peak weight: 155 Weight loss goal: to he healthy  Weight loss to date: 8 lbs Requesting obesity pharmacotherapy: yes Current weight loss supplements/medications: yes Previous weight loss supplements/meds: no   Relevant past medical, surgical, family and social history reviewed and updated as indicated. Interim medical history since our last visit reviewed. Allergies and  medications reviewed and updated.  Review of Systems  Constitutional: Negative.   Respiratory: Negative.   Cardiovascular: Negative.   Gastrointestinal: Negative.   Musculoskeletal: Negative.   Neurological: Negative.   Psychiatric/Behavioral: Negative.     Per HPI unless specifically indicated above     Objective:    BP (!) 133/91   Temp (!) 96.4 F (35.8 C) (Oral)   Wt 147 lb 6.4 oz (66.9 kg)   LMP 09/06/2020 (Approximate)   BMI 25.30 kg/m   Wt Readings from Last 3 Encounters:  09/15/20 147 lb 6.4 oz (66.9 kg)  03/23/20 148 lb (67.1 kg)  09/01/19 152 lb (68.9 kg)    Physical Exam Vitals and nursing note reviewed.  Constitutional:      General: She is not in acute distress.    Appearance: Normal appearance. She is not ill-appearing, toxic-appearing or diaphoretic.  HENT:     Head: Normocephalic and atraumatic.     Right Ear: External ear normal.     Left Ear: External ear normal.     Nose: Nose normal.     Mouth/Throat:     Mouth: Mucous membranes are moist.     Pharynx: Oropharynx is clear.  Eyes:     General: No scleral icterus.       Right eye: No discharge.        Left eye: No discharge.     Conjunctiva/sclera: Conjunctivae normal.     Pupils: Pupils are equal, round,  and reactive to light.  Pulmonary:     Effort: Pulmonary effort is normal. No respiratory distress.     Comments: Speaking in full sentences Musculoskeletal:        General: Normal range of motion.     Cervical back: Normal range of motion.  Skin:    Coloration: Skin is not jaundiced or pale.     Findings: No bruising, erythema, lesion or rash.  Neurological:     Mental Status: She is alert and oriented to person, place, and time. Mental status is at baseline.  Psychiatric:        Mood and Affect: Mood normal.        Behavior: Behavior normal.        Thought Content: Thought content normal.        Judgment: Judgment normal.     No results found for this or any previous visit.     Assessment & Plan:   Problem List Items Addressed This Visit      Other   Depression - Primary    Doing well on her meds, but having decreased libido- will cut her lexapro in 1/2 and see how that goes, will come off lexapro if needed. Continue to monitor. Call with any concerns.       Relevant Medications   buPROPion (WELLBUTRIN XL) 150 MG 24 hr tablet   escitalopram (LEXAPRO) 5 MG tablet    Other Visit Diagnoses    Weight gain       Tolerating meds well. No concerns. Refills given. Continue to monitor. Call with any concerns.        Follow up plan: Return in about 6 months (around 03/15/2021) for physical.    . This visit was completed via MyChart due to the restrictions of the COVID-19 pandemic. All issues as above were discussed and addressed. Physical exam was done as above through visual confirmation on MyChart. If it was felt that the patient should be evaluated in the office, they were directed there. The patient verbally consented to this visit. . Location of the patient: home . Location of the provider: home . Those involved with this call:  . Provider: Park Liter, DO . CMA: Vito Backers, CMA . Front Desk/Registration: Jill Side  . Time spent on call: 25 minutes with patient face to face via video conference. More than 50% of this time was spent in counseling and coordination of care. 40 minutes total spent in review of patient's record and preparation of their chart.

## 2020-09-15 NOTE — Assessment & Plan Note (Signed)
Doing well on her meds, but having decreased libido- will cut her lexapro in 1/2 and see how that goes, will come off lexapro if needed. Continue to monitor. Call with any concerns.

## 2020-09-26 ENCOUNTER — Telehealth: Payer: Self-pay

## 2020-10-03 ENCOUNTER — Emergency Department: Payer: BC Managed Care – PPO

## 2020-10-03 ENCOUNTER — Ambulatory Visit: Payer: Self-pay | Admitting: *Deleted

## 2020-10-03 ENCOUNTER — Encounter: Payer: Self-pay | Admitting: Emergency Medicine

## 2020-10-03 ENCOUNTER — Emergency Department
Admission: EM | Admit: 2020-10-03 | Discharge: 2020-10-03 | Disposition: A | Discharge status: Home / Self Care | Payer: BC Managed Care – PPO | Attending: Emergency Medicine | Admitting: Emergency Medicine

## 2020-10-03 ENCOUNTER — Other Ambulatory Visit: Payer: Self-pay

## 2020-10-03 DIAGNOSIS — R109 Unspecified abdominal pain: Secondary | ICD-10-CM | POA: Diagnosis not present

## 2020-10-03 DIAGNOSIS — D259 Leiomyoma of uterus, unspecified: Secondary | ICD-10-CM | POA: Insufficient documentation

## 2020-10-03 DIAGNOSIS — R102 Pelvic and perineal pain: Secondary | ICD-10-CM | POA: Diagnosis not present

## 2020-10-03 DIAGNOSIS — D252 Subserosal leiomyoma of uterus: Secondary | ICD-10-CM

## 2020-10-03 DIAGNOSIS — D251 Intramural leiomyoma of uterus: Secondary | ICD-10-CM

## 2020-10-03 DIAGNOSIS — Z79899 Other long term (current) drug therapy: Secondary | ICD-10-CM | POA: Insufficient documentation

## 2020-10-03 DIAGNOSIS — F17200 Nicotine dependence, unspecified, uncomplicated: Secondary | ICD-10-CM | POA: Insufficient documentation

## 2020-10-03 LAB — URINALYSIS, COMPLETE (UACMP) WITH MICROSCOPIC
Bilirubin Urine: NEGATIVE
Glucose, UA: NEGATIVE mg/dL
Hgb urine dipstick: NEGATIVE
Ketones, ur: NEGATIVE mg/dL
Leukocytes,Ua: NEGATIVE
Nitrite: NEGATIVE
Protein, ur: NEGATIVE mg/dL
Specific Gravity, Urine: 1.02 (ref 1.005–1.030)
pH: 5 (ref 5.0–8.0)

## 2020-10-03 LAB — CBC
HCT: 37.9 % (ref 36.0–46.0)
Hemoglobin: 12.8 g/dL (ref 12.0–15.0)
MCH: 31.1 pg (ref 26.0–34.0)
MCHC: 33.8 g/dL (ref 30.0–36.0)
MCV: 92 fL (ref 80.0–100.0)
Platelets: 288 10*3/uL (ref 150–400)
RBC: 4.12 MIL/uL (ref 3.87–5.11)
RDW: 12 % (ref 11.5–15.5)
WBC: 9.4 10*3/uL (ref 4.0–10.5)
nRBC: 0 % (ref 0.0–0.2)

## 2020-10-03 LAB — COMPREHENSIVE METABOLIC PANEL
ALT: 21 U/L (ref 0–44)
AST: 26 U/L (ref 15–41)
Albumin: 3.8 g/dL (ref 3.5–5.0)
Alkaline Phosphatase: 43 U/L (ref 38–126)
Anion gap: 7 (ref 5–15)
BUN: 14 mg/dL (ref 6–20)
CO2: 25 mmol/L (ref 22–32)
Calcium: 9.3 mg/dL (ref 8.9–10.3)
Chloride: 105 mmol/L (ref 98–111)
Creatinine, Ser: 0.56 mg/dL (ref 0.44–1.00)
GFR, Estimated: >60 mL/min (ref >60)
Glucose, Bld: 111 mg/dL — ABNORMAL HIGH (ref 70–99)
Potassium: 4.4 mmol/L (ref 3.5–5.1)
Sodium: 137 mmol/L (ref 135–145)
Total Bilirubin: 0.4 mg/dL (ref 0.3–1.2)
Total Protein: 7.1 g/dL (ref 6.5–8.1)

## 2020-10-03 LAB — LIPASE, BLOOD: Lipase: 33 U/L (ref 11–51)

## 2020-10-03 LAB — POC URINE PREG, ED: Preg Test, Ur: NEGATIVE

## 2020-10-03 MED ORDER — KETOROLAC TROMETHAMINE 30 MG/ML IJ SOLN
30.0000 mg | Freq: Once | INTRAMUSCULAR | Status: AC
  Administered 2020-10-03: 30 mg via INTRAVENOUS
  Filled 2020-10-03: qty 1

## 2020-10-03 MED ORDER — IOHEXOL 300 MG/ML  SOLN
100.0000 mL | Freq: Once | INTRAMUSCULAR | Status: AC | PRN
  Administered 2020-10-03: 100 mL via INTRAVENOUS
  Filled 2020-10-03: qty 100

## 2020-10-03 MED ORDER — MORPHINE SULFATE (PF) 4 MG/ML IV SOLN
4.0000 mg | Freq: Once | INTRAVENOUS | Status: AC
Start: 2020-10-03 — End: 2020-10-03
  Administered 2020-10-03: 4 mg via INTRAVENOUS
  Filled 2020-10-03: qty 1

## 2020-10-03 MED ORDER — ONDANSETRON HCL 4 MG/2ML IJ SOLN
4.0000 mg | Freq: Once | INTRAMUSCULAR | Status: AC
  Administered 2020-10-03: 4 mg via INTRAVENOUS
  Filled 2020-10-03: qty 2

## 2020-10-03 NOTE — ED Provider Notes (Signed)
  Patient received in signout from Dr. Corky Downs pending pelvic ultrasound for evaluation of ovarian cyst/torsion.  Ultrasound reviewed by me with 2 small uterine leiomyomata, each about 1 cm in size, likely the source of her periumbilical and suprapubic pain.  We discussed multimodal pain control and we discussed following up with her OB/GYN, and I provided her with resources to do so.  Pain is controlled here in the ED.  We discussed return precautions for the ED.  Patient medically stable for outpatient management.   Vladimir Crofts, MD 10/03/20 [phone]

## 2020-10-03 NOTE — Discharge Instructions (Addendum)
Please take Tylenol and ibuprofen/Advil for your pain.  It is safe to take them together, or to alternate them every few hours.  Take up to 1000mg  of Tylenol at a time, up to 4 times per day.  Do not take more than 4000 mg of Tylenol in 24 hours.  For ibuprofen, take 400-600 mg, 4-5 times per day.  You have evidence of 2 fibroids in uterus, each about 1 cm in size.  This is the likely source of your pain.  Please follow-up with your OB/GYN, and if you cannot see them before July, please see the attached phone number for Dr. Ouida Sills, one of our local OB/GYN doctors.  If you develop any significantly worsening pain, particularly with fevers, recurrent vomiting or new vaginal discharge/bleeding, please return to the ED.

## 2020-10-03 NOTE — Telephone Encounter (Signed)
Pt called in c/o a sharp abd pain that started yesterday morning upon awakening that is around her navel area and goes up under her breast bone.   Denies N/V/D, abd surgical history, not pregnant she is aware of, denies urinary symptoms.  Still having the sharp pains this morning so she called in.     Per the protocol I have referred her to the ED.   She was agreeable and is going to [facility] now.  The last visit she had was a virtual visit with Dr. Wynetta Emery per pt.   She used to see Merrie Roof.  I have sent my notes to [facility].  Reason for Disposition . [1] SEVERE pain (e.g., excruciating) AND [2] present > 1 hour  Answer Assessment - Initial Assessment Questions 1. LOCATION: "Where does it hurt?"      I'm having sharp pains since yesterday morning around my navel.    I had a BM yesterday that was normal.   The pain started before the BM and was no different after the BM.   Not on period 2. RADIATION: "Does the pain shoot anywhere else?" (e.g., chest, back)     It goes up under my breast bone.  I thought it was gas but gas medicine does not help.  No surgical history on abd. 3. ONSET: "When did the pain begin?" (e.g., minutes, hours or days ago)      Yesterday morning 4. SUDDEN: "Gradual or sudden onset?"     It's constant.   I woke up with the pain yesterday morning.   Then I was walking and it suddenly hit me so bad I was on the floor crying.   5. PATTERN "Does the pain come and go, or is it constant?"    - If constant: "Is it getting better, staying the same, or worsening?"      (Note: Constant means the pain never goes away completely; most serious pain is constant and it progresses)     - If intermittent: "How Latif does it last?" "Do you have pain now?"     (Note: Intermittent means the pain goes away completely between bouts)     It's constant.   Urine is clear, normal color and denies burning. 6. SEVERITY: "How bad is the pain?"  (e.g.,  Scale 1-10; mild, moderate, or severe)   - MILD (1-3): doesn't interfere with normal activities, abdomen soft and not tender to touch    - MODERATE (4-7): interferes with normal activities or awakens from sleep, tender to touch    - SEVERE (8-10): excruciating pain, doubled over, unable to do any normal activities      Severe.    I've had a cyst on my ovary Acero time ago.   7. RECURRENT SYMPTOM: "Have you ever had this type of stomach pain before?" If Yes, ask: "When was the last time?" and "What happened that time?"      No 8. CAUSE: "What do you think is causing the stomach pain?"     Maybe diverticulitis, appendix  9. RELIEVING/AGGRAVATING FACTORS: "What makes it better or worse?" (e.g., movement, antacids, bowel movement)     I tried Tums which did not help.   I used ibuprofen 2 tab for pain.     10. OTHER SYMPTOMS: "Has there been any vomiting, diarrhea, constipation, or urine problems?"       Denies all above symptoms 11. PREGNANCY: "Is there any chance you are pregnant?" "When was  your last menstrual period?"       No.  Protocols used: ABDOMINAL PAIN - [facility]

## 2020-10-03 NOTE — ED Provider Notes (Signed)
[facility] Emergency Department Provider Note   ____________________________________________    I have reviewed the triage vital signs and the nursing notes.   HISTORY  Chief Complaint Abdominal Pain     HPI Tammy Harper is a 40 y.o. female who presents with complaints of periumbilical abdominal pain, patient reports the pain started yesterday, she reports the pain is moderate to severe intensity.  She has taken over-the-counter medications with little improvement.  Denies vaginal discharge, no dysuria.  No history of abdominal surgery.  No fevers chills.  Past Medical History:  Diagnosis Date  . Depression     Patient Active Problem List   Diagnosis Date Noted  . Depression 02/02/2019    History reviewed. No pertinent surgical history.  Prior to Admission medications   Medication Sig Start Date End Date Taking? Authorizing Provider  buPROPion (WELLBUTRIN XL) 150 MG 24 hr tablet Take 1 tablet (150 mg total) by mouth daily. 09/15/20   Johnson, Megan P, DO  drospirenone-ethinyl estradiol (YASMIN) 3-0.03 MG tablet TAKE 1 TABLET BY MOUTH DAILY 09/08/20   Johnson, Megan P, DO  escitalopram (LEXAPRO) 5 MG tablet Take 1 tablet (5 mg total) by mouth daily. 09/15/20   Johnson, Megan P, DO  topiramate (TOPAMAX) 50 MG tablet Take 1 tablet (50 mg total) by mouth 2 (two) times daily. 09/15/20   Valerie Roys, DO     Allergies Zoloft [sertraline]  Family History  Problem Relation Age of Onset  . Thyroid disease Father   . Emphysema Maternal Grandmother   . Emphysema Maternal Grandfather   . Diabetes Paternal Grandfather     Social History Social History   Tobacco Use  . Smoking status: Current Some Day Smoker  . Smokeless tobacco: Never Used  Vaping Use  . Vaping Use: Never used  Substance Use Topics  . Alcohol use: Yes    Comment: once a week/ socially  . Drug use: Never    Review of Systems  Constitutional: No  fever/chills Eyes: No visual changes.  ENT: No sore throat. Cardiovascular: Denies chest pain. Respiratory: Denies shortness of breath. Gastrointestinal: As above Genitourinary: Negative for dysuria. Musculoskeletal: Negative for back pain. Skin: Negative for rash. Neurological: Negative for headaches or weakness   ____________________________________________   PHYSICAL EXAM:  VITAL SIGNS: ED Triage Vitals  Enc Vitals Group     BP 10/03/20 1056 (!) 150/87     Pulse Rate 10/03/20 1056 62     Resp 10/03/20 1056 16     Temp 10/03/20 1056 98.6 F (37 C)     Temp Source 10/03/20 1056 Oral     SpO2 10/03/20 1056 100 %     Weight 10/03/20 1058 66.9 kg (147 lb 6.4 oz)     Height 10/03/20 1058 1.626 m (5\' 4" )     Head Circumference --      Peak Flow --      Pain Score 10/03/20 1058 6     Pain Loc --      Pain Edu? --      Excl. in [place]? --     Constitutional: Alert and oriented. . Pleasant and interactive  Nose: No congestion/rhinnorhea. Mouth/Throat: Mucous membranes are moist.    Cardiovascular: Normal rate, regular rhythm. Grossly normal heart sounds.  Good peripheral circulation. Respiratory: Normal respiratory effort.  No retractions. Lungs CTAB. Gastrointestinal: Soft, mild periumbilical tenderness to palpation. No distention.  No CVA tenderness.  Musculoskeletal: No lower extremity tenderness nor edema.  Warm  and well perfused Neurologic:  Normal speech and language. No gross focal neurologic deficits are appreciated.  Skin:  Skin is warm, dry and intact. No rash noted. Psychiatric: Mood and affect are normal. Speech and behavior are normal.  ____________________________________________   LABS (all labs ordered are listed, but only abnormal results are displayed)  Labs Reviewed  COMPREHENSIVE METABOLIC PANEL - Abnormal; Notable for the following components:      Result Value   Glucose, Bld 111 (*)    All other components within normal limits  URINALYSIS,  COMPLETE (UACMP) WITH MICROSCOPIC - Abnormal; Notable for the following components:   Color, Urine YELLOW (*)    APPearance HAZY (*)    Bacteria, UA RARE (*)    All other components within normal limits  LIPASE, BLOOD  CBC  POC URINE PREG, ED   ____________________________________________  EKG   ____________________________________________  RADIOLOGY  CT renal stone study reviewed by me, radiology notes only mild free fluid, 7 mm nodule discussed with patient and the need for outpatient follow-up with her PCP Ultrasound pending ____________________________________________   PROCEDURES  Procedure(s) performed: No  Procedures   Critical Care performed: No ____________________________________________   INITIAL IMPRESSION / ASSESSMENT AND PLAN / ED COURSE  Pertinent labs & imaging results that were available during my care of the patient were reviewed by me and considered in my medical decision making (see chart for details).  Patient presents with abdominal pain as described above.  Differential includes UTI, ovarian cyst, diverticulitis, less likely appendicitis versus ureterolithiasis  We will treat with IM Toradol, obtain CT renal stone study and reevaluate  CT scan is overall reassuring, mild free fluid in the pelvis noted, will send for ultrasound to evaluate possible ovarian cyst.  Will treat with IV morphine and Zofran because of continued pain.    ____________________________________________   FINAL CLINICAL IMPRESSION(S) / ED DIAGNOSES  Final diagnoses:  Pelvic pain        Note:  This document was prepared using Dragon voice recognition software and may include unintentional dictation errors.   Lavonia Drafts, MD 10/03/20 [phone]

## 2020-10-03 NOTE — ED Triage Notes (Signed)
C/O mid abdominal pain x 1 day.  Denies N/V.  Denies dysuria.  Last [facility] 1/30/

## 2020-10-04 NOTE — Telephone Encounter (Signed)
erroneous error  

## 2021-03-13 ENCOUNTER — Encounter: Payer: BC Managed Care – PPO | Admitting: Nurse Practitioner

## 2021-03-23 ENCOUNTER — Encounter: Payer: Self-pay | Admitting: Emergency Medicine

## 2021-04-05 ENCOUNTER — Encounter: Payer: BC Managed Care – PPO | Admitting: Nurse Practitioner

## 2021-04-30 ENCOUNTER — Other Ambulatory Visit: Payer: Self-pay | Admitting: Family Medicine

## 2021-04-30 NOTE — Telephone Encounter (Signed)
Requested medication (s) are due for refill today: yes  Requested medication (s) are on the active medication list: yes  Last refill:  09/15/20 for both meds  Future visit scheduled: yes  Notes to clinic:  please advise to how much to RF. H/o cancellations   Requested Prescriptions  Pending Prescriptions Disp Refills   buPROPion (WELLBUTRIN XL) 150 MG 24 hr tablet [Pharmacy Med Name: BUPROPION XL '150MG'$  TABLETS (24 H)] 90 tablet 1    Sig: TAKE 1 TABLET(150 MG) BY MOUTH DAILY     Psychiatry: Antidepressants - bupropion Failed - 04/30/2021 11:50 AM      Failed - Last BP in normal range    BP Readings from Last 1 Encounters:  10/03/20 (!) 162/87          Failed - Valid encounter within last 6 months    Recent Outpatient Visits           7 months ago Mild episode of recurrent major depressive disorder ([place])   [place], [place], DO   1 year ago Weight gain   [facility], [place], [place]   1 year ago Mild episode of recurrent major depressive disorder [facility])   [place], Rachel [place], [place]   1 year ago Mild episode of recurrent major depressive disorder [facility])   [place], [place], DO   1 year ago Abdominal cramping   [facility] [place], [place]       Future Appointments             In 3 days Jon Billings, NP [place], [place] - Completed PHQ-2 or PHQ-9 in the last 360 days       escitalopram (LEXAPRO) 5 MG tablet [Pharmacy Med Name: ESCITALOPRAM '5MG'$  TABLETS] 90 tablet 1    Sig: TAKE 1 TABLET(5 MG) BY MOUTH DAILY     Psychiatry:  Antidepressants - SSRI Failed - 04/30/2021 11:50 AM      Failed - Valid encounter within last 6 months    Recent Outpatient Visits           7 months ago Mild episode of recurrent major depressive disorder ([place])   [place], [place], DO   1 year ago Weight gain    [facility], [place], [place]   1 year ago Mild episode of recurrent major depressive disorder [facility])   [facility] [place], [place]   1 year ago Mild episode of recurrent major depressive disorder [facility])   [place], [place], DO   1 year ago Abdominal cramping   [facility] [place], [place]       Future Appointments             In 3 days Jon Billings, NP [facility], [place] - Completed PHQ-2 or PHQ-9 in the last 360 days

## 2021-05-01 ENCOUNTER — Telehealth: Payer: Self-pay

## 2021-05-01 NOTE — Telephone Encounter (Signed)
Patient last seen 09/15/20 and has appointment 05/03/21

## 2021-05-01 NOTE — Telephone Encounter (Signed)
Medications refilled today for 30 day supply. Tried calling patient to notify her, no answer and VM full. Will try to call again later.   Copied from [place] [phone]. Topic: Quick Communication - Rx Refill/Question >> May 01, 2021  8:36 AM Loma Boston wrote: Medication: buPROPion (WELLBUTRIN XL) 150 MG 24 hr tablet Medication Date: 09/15/2020 Department: [facility] Ordering/Authorizing: Valerie Roys, DO  Pt states has an appt for 8/31/CPE out of this med, feeling withdrawals could be called in today. Screened for appt 8/31  Has the patient contacted their pharmacy? Yes.   (Agent: If no, request that the patient contact the pharmacy for the refill.) (Agent: If yes, when and what did the pharmacy advise?) call dr   Preferred Pharmacy (with phone number or street name): [facility] [place], [address] [place] [address] Phone: [phone] Fax: [phone] Hours: Not open 24 hours    Agent: Please be advised that RX refills may take up to 3 business days. We ask that you follow-up with your pharmacy.

## 2021-05-03 ENCOUNTER — Ambulatory Visit (INDEPENDENT_AMBULATORY_CARE_PROVIDER_SITE_OTHER): Payer: BC Managed Care – PPO | Admitting: Nurse Practitioner

## 2021-05-03 ENCOUNTER — Other Ambulatory Visit: Payer: Self-pay

## 2021-05-03 ENCOUNTER — Encounter: Payer: Self-pay | Admitting: Nurse Practitioner

## 2021-05-03 VITALS — BP 122/69 | HR 79 | Temp 98.8°F | Ht 63.94 in | Wt 150.0 lb

## 2021-05-03 DIAGNOSIS — Z1231 Encounter for screening mammogram for malignant neoplasm of breast: Secondary | ICD-10-CM

## 2021-05-03 DIAGNOSIS — Z0000 Encounter for general adult medical examination without abnormal findings: Secondary | ICD-10-CM | POA: Diagnosis not present

## 2021-05-03 DIAGNOSIS — Z114 Encounter for screening for human immunodeficiency virus [HIV]: Secondary | ICD-10-CM

## 2021-05-03 DIAGNOSIS — F330 Major depressive disorder, recurrent, mild: Secondary | ICD-10-CM | POA: Diagnosis not present

## 2021-05-03 DIAGNOSIS — Z23 Encounter for immunization: Secondary | ICD-10-CM | POA: Diagnosis not present

## 2021-05-03 DIAGNOSIS — Z1159 Encounter for screening for other viral diseases: Secondary | ICD-10-CM

## 2021-05-03 LAB — URINALYSIS, ROUTINE W REFLEX MICROSCOPIC
Bilirubin, UA: NEGATIVE
Glucose, UA: NEGATIVE
Ketones, UA: NEGATIVE
Leukocytes,UA: NEGATIVE
Nitrite, UA: NEGATIVE
Protein,UA: NEGATIVE
RBC, UA: NEGATIVE
Specific Gravity, UA: 1.02 (ref 1.005–1.030)
Urobilinogen, Ur: 0.2 mg/dL (ref 0.2–1.0)
pH, UA: 7 (ref 5.0–7.5)

## 2021-05-03 MED ORDER — BUPROPION HCL ER (XL) 150 MG PO TB24: ORAL_TABLET | ORAL | 1 refills | Status: DC

## 2021-05-03 MED ORDER — TOPIRAMATE 50 MG PO TABS: 50.0000 mg | ORAL_TABLET | Freq: Two times a day (BID) | ORAL | 1 refills | Status: DC

## 2021-05-03 MED ORDER — DROSPIRENONE-ETHINYL ESTRADIOL 3-0.03 MG PO TABS: 1.0000 | ORAL_TABLET | Freq: Every day | ORAL | 1 refills | Status: DC

## 2021-05-03 NOTE — Assessment & Plan Note (Signed)
Chronic.  Controlled.  Continue with current medication regimen of Wellbutrin '150mg'$  daily.  Labs ordered today.  If lab work is normal, will stop Lexapro to see if sex drive increases.  If depression worsens with stopping Lexapro can increase Wellbutrin.  Return to clinic in 6 weeks for reevaluation.  Call sooner if concerns arise.

## 2021-05-03 NOTE — Progress Notes (Signed)
BP 122/69   Pulse 79   Temp 98.8 F (37.1 C)   Ht 5' 3.94" (1.624 m)   Wt 150 lb (68 kg)   LMP 05/03/2021 (Exact Date)   SpO2 99%   BMI 25.80 kg/m    Subjective:    Patient ID: Tammy Harper, female    DOB: August 13, 1981, 40 y.o.   MRN: [id number]  HPI: Tammy Harper is a 40 y.o. female presenting on 05/03/2021 for comprehensive medical examination. Current medical complaints include:none  She currently lives with: Menopausal Symptoms: no  DEPRESSION Patient states she has no sexual desire.  She wakes up 2-3 times per night.  She states she is very irritable.  Denies feeling down depression.  Denies SI.    Denies HA, CP, SOB, dizziness, palpitations, visual changes, and lower extremity swelling.  Depression Screen done today and results listed below:  Depression screen [facility] 2/9 05/03/2021 09/15/2020 03/23/2020 01/05/2020 09/30/2019  Decreased Interest 0 0 0 0 0  Down, Depressed, Hopeless 0 0 0 0 0  PHQ - 2 Score 0 0 0 0 0  Altered sleeping 3 0 0 2 2  Tired, decreased energy 0 0 0 0 0  Change in appetite 0 0 0 2 1  Feeling bad or failure about yourself  0 0 0 0 0  Trouble concentrating 0 0 0 0 0  Moving slowly or fidgety/restless 0 0 0 0 0  Suicidal thoughts 0 0 0 0 0  PHQ-9 Score 3 0 0 4 3  Difficult doing work/chores Not difficult at all Not difficult at all - Not difficult at all Not difficult at all    The patient does not have a history of falls. I did complete a risk assessment for falls. A plan of care for falls was documented.   Past Medical History:  Past Medical History:  Diagnosis Date   Depression     Surgical History:  History reviewed. No pertinent surgical history.  Medications:  Current Outpatient Medications on File Prior to Visit  Medication Sig   escitalopram (LEXAPRO) 5 MG tablet TAKE 1 TABLET(5 MG) BY MOUTH DAILY   No current facility-administered medications on file prior to visit.    Allergies:  Allergies  Allergen Reactions    Zoloft [Sertraline]     GI Issues    Social History:  Social History   Socioeconomic History   Marital status: Single    Spouse name: Not on file   Number of children: Not on file   Years of education: Not on file   Highest education level: Not on file  Occupational History   Not on file  Tobacco Use   Smoking status: Some Days   Smokeless tobacco: Never  Vaping Use   Vaping Use: Never used  Substance and Sexual Activity   Alcohol use: Yes    Comment: once a week/ socially   Drug use: Never   Sexual activity: Yes  Other Topics Concern   Not on file  Social History Narrative   ** Merged History Encounter **       Social Determinants of Health   Financial Resource Strain: Not on file  Food Insecurity: Not on file  Transportation Needs: Not on file  Physical Activity: Not on file  Stress: Not on file  Social Connections: Not on file  Intimate Partner Violence: Not on file   Social History   Tobacco Use  Smoking Status Some Days  Smokeless Tobacco Never   Social  History   Substance and Sexual Activity  Alcohol Use Yes   Comment: once a week/ socially    Family History:  Family History  Problem Relation Age of Onset   Thyroid disease Father    Emphysema Maternal Grandmother    Emphysema Maternal Grandfather    Diabetes Paternal Grandfather     Past medical history, surgical history, medications, allergies, family history and social history reviewed with patient today and changes made to appropriate areas of the chart.   Review of Systems  Eyes:  Negative for blurred vision and double vision.  Respiratory:  Negative for shortness of breath.   Cardiovascular:  Negative for chest pain, palpitations and leg swelling.  Neurological:  Negative for dizziness and headaches.  Psychiatric/Behavioral:  Negative for depression and suicidal ideas.        Sleep disturbance and irritability  All other ROS negative except what is listed above and in the HPI.       Objective:    BP 122/69   Pulse 79   Temp 98.8 F (37.1 C)   Ht 5' 3.94" (1.624 m)   Wt 150 lb (68 kg)   LMP 05/03/2021 (Exact Date)   SpO2 99%   BMI 25.80 kg/m   Wt Readings from Last 3 Encounters:  05/03/21 150 lb (68 kg)  10/03/20 147 lb 6.4 oz (66.9 kg)  09/15/20 147 lb 6.4 oz (66.9 kg)    Physical Exam Vitals and nursing note reviewed.  Constitutional:      General: She is awake. She is not in acute distress.    Appearance: She is well-developed. She is not ill-appearing.  HENT:     Head: Normocephalic and atraumatic.     Right Ear: Hearing, tympanic membrane, ear canal and external ear normal. No drainage.     Left Ear: Hearing, tympanic membrane, ear canal and external ear normal. No drainage.     Nose: Nose normal.     Right Sinus: No maxillary sinus tenderness or frontal sinus tenderness.     Left Sinus: No maxillary sinus tenderness or frontal sinus tenderness.     Mouth/Throat:     Mouth: Mucous membranes are moist.     Pharynx: Oropharynx is clear. Uvula midline. No pharyngeal swelling, oropharyngeal exudate or posterior oropharyngeal erythema.  Eyes:     General: Lids are normal.        Right eye: No discharge.        Left eye: No discharge.     Extraocular Movements: Extraocular movements intact.     Conjunctiva/sclera: Conjunctivae normal.     Pupils: Pupils are equal, round, and reactive to light.     Visual Fields: Right eye visual fields normal and left eye visual fields normal.  Neck:     Thyroid: No thyromegaly.     Vascular: No carotid bruit.     Trachea: Trachea normal.  Cardiovascular:     Rate and Rhythm: Normal rate and regular rhythm.     Heart sounds: Normal heart sounds. No murmur heard.   No gallop.  Pulmonary:     Effort: Pulmonary effort is normal. No accessory muscle usage or respiratory distress.     Breath sounds: Normal breath sounds.  Chest:  Breasts:    Right: Normal.     Left: Normal.  Abdominal:     General: Bowel sounds  are normal.     Palpations: Abdomen is soft. There is no hepatomegaly or splenomegaly.     Tenderness: There is  no abdominal tenderness.  Musculoskeletal:        General: Normal range of motion.     Cervical back: Normal range of motion and neck supple.     Right lower leg: No edema.     Left lower leg: No edema.  Lymphadenopathy:     Head:     Right side of head: No submental, submandibular, tonsillar, preauricular or posterior auricular adenopathy.     Left side of head: No submental, submandibular, tonsillar, preauricular or posterior auricular adenopathy.     Cervical: No cervical adenopathy.     Upper Body:     Right upper body: No supraclavicular, axillary or pectoral adenopathy.     Left upper body: No supraclavicular, axillary or pectoral adenopathy.  Skin:    General: Skin is warm and dry.     Capillary Refill: Capillary refill takes less than 2 seconds.     Findings: No rash.  Neurological:     Mental Status: She is alert and oriented to person, place, and time.     Cranial Nerves: Cranial nerves are intact.     Gait: Gait is intact.     Deep Tendon Reflexes: Reflexes are normal and symmetric.     Reflex Scores:      Brachioradialis reflexes are 2+ on the right side and 2+ on the left side.      Patellar reflexes are 2+ on the right side and 2+ on the left side. Psychiatric:        Attention and Perception: Attention normal.        Mood and Affect: Mood normal.        Speech: Speech normal.        Behavior: Behavior normal. Behavior is cooperative.        Thought Content: Thought content normal.        Judgment: Judgment normal.    Results for orders placed or performed during the hospital encounter of 10/03/20  Lipase, blood  Result Value Ref Range   Lipase 33 11 - 51 U/L  Comprehensive metabolic panel  Result Value Ref Range   Sodium 137 135 - 145 mmol/L   Potassium 4.4 3.5 - 5.1 mmol/L   Chloride 105 98 - 111 mmol/L   CO2 25 22 - 32 mmol/L   Glucose, Bld  111 (H) 70 - 99 mg/dL   BUN 14 6 - 20 mg/dL   Creatinine, Ser 0.56 0.44 - 1.00 mg/dL   Calcium 9.3 8.9 - 10.3 mg/dL   Total Protein 7.1 6.5 - 8.1 g/dL   Albumin 3.8 3.5 - 5.0 g/dL   AST 26 15 - 41 U/L   ALT 21 0 - 44 U/L   Alkaline Phosphatase 43 38 - 126 U/L   Total Bilirubin 0.4 0.3 - 1.2 mg/dL   GFR, Estimated >60 >60 mL/min   Anion gap 7 5 - 15  CBC  Result Value Ref Range   WBC 9.4 4.0 - 10.5 K/uL   RBC 4.12 3.87 - 5.11 MIL/uL   Hemoglobin 12.8 12.0 - 15.0 g/dL   HCT 37.9 36.0 - 46.0 %   MCV 92.0 80.0 - 100.0 fL   MCH 31.1 26.0 - 34.0 pg   MCHC 33.8 30.0 - 36.0 g/dL   RDW 12.0 11.5 - 15.5 %   Platelets 288 150 - 400 K/uL   nRBC 0.0 0.0 - 0.2 %  Urinalysis, Complete w Microscopic  Result Value Ref Range   Color, Urine YELLOW (A) YELLOW  APPearance HAZY (A) CLEAR   Specific Gravity, Urine 1.020 1.005 - 1.030   pH 5.0 5.0 - 8.0   Glucose, UA NEGATIVE NEGATIVE mg/dL   Hgb urine dipstick NEGATIVE NEGATIVE   Bilirubin Urine NEGATIVE NEGATIVE   Ketones, ur NEGATIVE NEGATIVE mg/dL   Protein, ur NEGATIVE NEGATIVE mg/dL   Nitrite NEGATIVE NEGATIVE   Leukocytes,Ua NEGATIVE NEGATIVE   RBC / HPF 0-5 0 - 5 RBC/hpf   WBC, UA 0-5 0 - 5 WBC/hpf   Bacteria, UA RARE (A) NONE SEEN   Squamous Epithelial / LPF 0-5 0 - 5   Mucus PRESENT   POC urine preg, ED  Result Value Ref Range   Preg Test, Ur NEGATIVE NEGATIVE      Assessment & Plan:   Problem List Items Addressed This Visit       Other   Depression    Chronic.  Controlled.  Continue with current medication regimen of Wellbutrin '150mg'$  daily.  Labs ordered today.  If lab work is normal, will stop Lexapro to see if sex drive increases.  If depression worsens with stopping Lexapro can increase Wellbutrin.  Return to clinic in 6 weeks for reevaluation.  Call sooner if concerns arise.        Relevant Medications   buPROPion (WELLBUTRIN XL) 150 MG 24 hr tablet   Other Visit Diagnoses     Annual physical exam    -  Primary    Health maintenance reviewed during visit today. Labs ordered. Refills sent. Will return for PAP. TDAP given during visit today.   Relevant Orders   CBC with Differential/Platelet   Comprehensive metabolic panel   Lipid panel   TSH   Urinalysis, Routine w reflex microscopic   Immunization due       Relevant Orders   Tdap vaccine greater than or equal to 7yo IM (Completed)   Encounter for screening mammogram for malignant neoplasm of breast       Relevant Orders   MM Digital Screening   Encounter for hepatitis C screening test for low risk patient       Relevant Orders   Hepatitis C Antibody   Screening for HIV (human immunodeficiency virus)       Relevant Orders   HIV Antibody (routine testing w rflx)        Follow up plan: Return in about 6 weeks (around 06/14/2021) for PAP/breast exam.   LABORATORY TESTING:  - Pap smear:  Patient will return for PAP. She started her period today.  IMMUNIZATIONS:   - Tdap: Tetanus vaccination status reviewed: last tetanus booster within 10 years. - Influenza: Postponed to flu season - Pneumovax: Refused - Prevnar: Not applicable - HPV: Refused - Zostavax vaccine: Not applicable  SCREENING: -Mammogram: Ordered today  - Colonoscopy: Not applicable  - Bone Density: Not applicable  -Hearing Test: Not applicable  -Spirometry: Not applicable   PATIENT COUNSELING:   Advised to take 1 mg of folate supplement per day if capable of pregnancy.   Sexuality: Discussed sexually transmitted diseases, partner selection, use of condoms, avoidance of unintended pregnancy  and contraceptive alternatives.   Advised to avoid cigarette smoking.  I discussed with the patient that most people either abstain from alcohol or drink within safe limits (<=14/week and <=4 drinks/occasion for males, <=7/weeks and <= 3 drinks/occasion for females) and that the risk for alcohol disorders and other health effects rises proportionally with the number of drinks  per week and how often a drinker exceeds daily  limits.  Discussed cessation/primary prevention of drug use and availability of treatment for abuse.   Diet: Encouraged to adjust caloric intake to maintain  or achieve ideal body weight, to reduce intake of dietary saturated fat and total fat, to limit sodium intake by avoiding high sodium foods and not adding table salt, and to maintain adequate dietary potassium and calcium preferably from fresh fruits, vegetables, and low-fat dairy products.    stressed the importance of regular exercise  Injury prevention: Discussed safety belts, safety helmets, smoke detector, smoking near bedding or upholstery.   Dental health: Discussed importance of regular tooth brushing, flossing, and dental visits.    NEXT PREVENTATIVE PHYSICAL DUE IN 1 YEAR. Return in about 6 weeks (around 06/14/2021) for PAP/breast exam.

## 2021-05-04 LAB — CBC WITH DIFFERENTIAL/PLATELET
Basophils Absolute: 0.1 10*3/uL (ref 0.0–0.2)
Basos: 1 %
EOS (ABSOLUTE): 0.1 10*3/uL (ref 0.0–0.4)
Eos: 1 %
Hematocrit: 37.2 % (ref 34.0–46.6)
Hemoglobin: 12.3 g/dL (ref 11.1–15.9)
Immature Grans (Abs): 0 10*3/uL (ref 0.0–0.1)
Immature Granulocytes: 0 %
Lymphocytes Absolute: 2.3 10*3/uL (ref 0.7–3.1)
Lymphs: 42 %
MCH: 31 pg (ref 26.6–33.0)
MCHC: 33.1 g/dL (ref 31.5–35.7)
MCV: 94 fL (ref 79–97)
Monocytes Absolute: 0.5 10*3/uL (ref 0.1–0.9)
Monocytes: 9 %
Neutrophils Absolute: 2.5 10*3/uL (ref 1.4–7.0)
Neutrophils: 47 %
Platelets: 270 10*3/uL (ref 150–450)
RBC: 3.97 x10E6/uL (ref 3.77–5.28)
RDW: 11.8 % (ref 11.7–15.4)
WBC: 5.4 10*3/uL (ref 3.4–10.8)

## 2021-05-04 LAB — COMPREHENSIVE METABOLIC PANEL
ALT: 11 IU/L (ref 0–32)
AST: 13 IU/L (ref 0–40)
Albumin/Globulin Ratio: 1.8 (ref 1.2–2.2)
Albumin: 4.3 g/dL (ref 3.8–4.8)
Alkaline Phosphatase: 50 IU/L (ref 44–121)
BUN/Creatinine Ratio: 36 — ABNORMAL HIGH (ref 9–23)
BUN: 20 mg/dL (ref 6–24)
Bilirubin Total: 0.2 mg/dL (ref 0.0–1.2)
CO2: 22 mmol/L (ref 20–29)
Calcium: 9.3 mg/dL (ref 8.7–10.2)
Chloride: 102 mmol/L (ref 96–106)
Creatinine, Ser: 0.56 mg/dL — ABNORMAL LOW (ref 0.57–1.00)
Globulin, Total: 2.4 g/dL (ref 1.5–4.5)
Glucose: 80 mg/dL (ref 65–99)
Potassium: 4.2 mmol/L (ref 3.5–5.2)
Sodium: 137 mmol/L (ref 134–144)
Total Protein: 6.7 g/dL (ref 6.0–8.5)
eGFR: 118 mL/min/{1.73_m2} (ref >59)

## 2021-05-04 LAB — HEPATITIS C ANTIBODY: Hep C Virus Ab: <0.1 s/co ratio (ref 0.0–0.9)

## 2021-05-04 LAB — LIPID PANEL
Chol/HDL Ratio: 2.2 ratio (ref 0.0–4.4)
Cholesterol, Total: 201 mg/dL — ABNORMAL HIGH (ref 100–199)
HDL: 91 mg/dL (ref >39)
LDL Chol Calc (NIH): 89 mg/dL (ref 0–99)
Triglycerides: 121 mg/dL (ref 0–149)
VLDL Cholesterol Cal: 21 mg/dL (ref 5–40)

## 2021-05-04 LAB — TSH: TSH: 2.22 u[IU]/mL (ref 0.450–4.500)

## 2021-05-04 LAB — HIV ANTIBODY (ROUTINE TESTING W REFLEX): HIV Screen 4th Generation wRfx: NONREACTIVE

## 2021-06-03 ENCOUNTER — Other Ambulatory Visit: Payer: Self-pay | Admitting: Family Medicine

## 2021-06-03 ENCOUNTER — Other Ambulatory Visit: Payer: Self-pay | Admitting: Nurse Practitioner

## 2021-06-05 MED ORDER — ESCITALOPRAM OXALATE 5 MG PO TABS: 5.0000 mg | ORAL_TABLET | Freq: Every day | ORAL | 0 refills | Status: DC

## 2021-06-21 ENCOUNTER — Ambulatory Visit: Payer: BC Managed Care – PPO | Admitting: Nurse Practitioner

## 2021-06-22 NOTE — Progress Notes (Unsigned)
Called patient to reschedule missed appointment, could not leave vm (box is full.)

## 2021-07-18 ENCOUNTER — Telehealth: Payer: Self-pay | Admitting: Nurse Practitioner

## 2021-07-18 ENCOUNTER — Other Ambulatory Visit: Payer: Self-pay

## 2021-07-19 MED ORDER — ESCITALOPRAM OXALATE 5 MG PO TABS: 5.0000 mg | ORAL_TABLET | Freq: Every day | ORAL | 0 refills | Status: DC

## 2021-07-19 NOTE — Telephone Encounter (Signed)
Requested medication (s) are due for refill today:   Yes  Requested medication (s) are on the active medication list:   Yes  Future visit scheduled:   No  She cancelled the appt on 06/21/2021    LOV 05/03/2021   Last ordered: 06/05/2021 #30, 0 refills   Returned because wasn't sure whether Jon Billings wants to refill this or not since she cancelled her last appt.   Requested Prescriptions  Pending Prescriptions Disp Refills   escitalopram (LEXAPRO) 5 MG tablet [Pharmacy Med Name: ESCITALOPRAM 5MG  TABLETS] 30 tablet 0    Sig: TAKE 1 TABLET(5 MG) BY MOUTH DAILY     Psychiatry:  Antidepressants - SSRI Passed - 07/18/2021  7:31 PM      Passed - Completed PHQ-2 or PHQ-9 in the last 360 days      Passed - Valid encounter within last 6 months    Recent Outpatient Visits           2 months ago Annual physical exam   [facility] Jon Billings, NP   10 months ago Mild episode of recurrent major depressive disorder [facility])   [place], [place], DO   1 year ago Weight gain   [facility], [place], [place]   1 year ago Mild episode of recurrent major depressive disorder [facility])   [facility] Volney [place], [place]   1 year ago Mild episode of recurrent major depressive disorder [facility])   [place], Megan P, DO

## 2021-07-19 NOTE — Telephone Encounter (Signed)
Patient last seen 05/03/21 for CPE

## 2021-07-19 NOTE — Telephone Encounter (Signed)
Pt called in wanting to know why her medication was denied, pt also brought up that provider wanted to take her off, but pt explained its not good for her mentally to get off, please advise.

## 2021-07-19 NOTE — Telephone Encounter (Signed)
Medication sent to the pharmacy.

## 2021-07-19 NOTE — Telephone Encounter (Signed)
Patient was supposed to follow up in October.  I can send her a 30 day supply until she makes an appointment.

## 2021-07-19 NOTE — Addendum Note (Signed)
Addended by: Jon Billings on: 07/19/2021 03:11 PM   Modules accepted: Orders

## 2021-07-19 NOTE — Telephone Encounter (Signed)
Pt called to schedule an appt on 07/24/21. She is requesting to have a med refill until that appt. Please advise.

## 2021-07-24 ENCOUNTER — Ambulatory Visit: Payer: BC Managed Care – PPO | Admitting: Nurse Practitioner

## 2021-07-31 ENCOUNTER — Encounter: Payer: Self-pay | Admitting: Nurse Practitioner

## 2021-07-31 ENCOUNTER — Telehealth (INDEPENDENT_AMBULATORY_CARE_PROVIDER_SITE_OTHER): Payer: BC Managed Care – PPO | Admitting: Nurse Practitioner

## 2021-07-31 DIAGNOSIS — F330 Major depressive disorder, recurrent, mild: Secondary | ICD-10-CM | POA: Diagnosis not present

## 2021-07-31 MED ORDER — BUPROPION HCL ER (XL) 300 MG PO TB24: 300.0000 mg | ORAL_TABLET | Freq: Every day | ORAL | 1 refills | Status: DC

## 2021-07-31 MED ORDER — ESCITALOPRAM OXALATE 5 MG PO TABS: 5.0000 mg | ORAL_TABLET | Freq: Every day | ORAL | 1 refills | Status: DC

## 2021-07-31 NOTE — Progress Notes (Signed)
There were no vitals taken for this visit.   Subjective:    Patient ID: Tammy Harper, female    DOB: 08-Jul-1981, 40 y.o.   MRN: [id number]  HPI: Tammy Harper is a 40 y.o. female  Chief Complaint  Patient presents with   Depression    DEPRESSION/ANXIETY Patient states she feels like the current regimen is working well for her. She has tried to get off of the Lexapro but she was crying and more emotional.  Does feel like anxiety is worse.  Denies SI.   GAD 7 : Generalized Anxiety Score 07/31/2021 03/23/2020 01/05/2020 09/30/2019  Nervous, Anxious, on Edge 1 0 1 1  Control/stop worrying 1 0 0 0  Worry too much - different things 1 0 0 1  Trouble relaxing 1 0 1 0  Restless 0 0 0 0  Easily annoyed or irritable 1 0 2 1  Afraid - awful might happen 0 0 0 0  Total GAD 7 Score 5 0 4 3  Anxiety Difficulty Not difficult at all - Somewhat difficult Not difficult at all    Flowsheet Row Video Visit from 07/31/2021 in [place]  PHQ-9 Total Score 0          Relevant past medical, surgical, family and social history reviewed and updated as indicated. Interim medical history since our last visit reviewed. Allergies and medications reviewed and updated.  Review of Systems  Psychiatric/Behavioral:  Negative for dysphoric mood and suicidal ideas. The patient is nervous/anxious.    Per HPI unless specifically indicated above     Objective:    There were no vitals taken for this visit.  Wt Readings from Last 3 Encounters:  05/03/21 150 lb (68 kg)  10/03/20 147 lb 6.4 oz (66.9 kg)  09/15/20 147 lb 6.4 oz (66.9 kg)    Physical Exam Vitals and nursing note reviewed.  HENT:     Head: Normocephalic.     Right Ear: Hearing normal.     Left Ear: Hearing normal.     Nose: Nose normal.  Eyes:     Pupils: Pupils are equal, round, and reactive to light.  Pulmonary:     Effort: Pulmonary effort is normal. No respiratory distress.  Neurological:     Mental  Status: She is alert.  Psychiatric:        Mood and Affect: Mood normal.        Behavior: Behavior normal.        Thought Content: Thought content normal.        Judgment: Judgment normal.    Results for orders placed or performed in visit on 05/03/21  CBC with Differential/Platelet  Result Value Ref Range   WBC 5.4 3.4 - 10.8 x10E3/uL   RBC 3.97 3.77 - 5.28 x10E6/uL   Hemoglobin 12.3 11.1 - 15.9 g/dL   Hematocrit 37.2 34.0 - 46.6 %   MCV 94 79 - 97 fL   MCH 31.0 26.6 - 33.0 pg   MCHC 33.1 31.5 - 35.7 g/dL   RDW 11.8 11.7 - 15.4 %   Platelets 270 150 - 450 x10E3/uL   Neutrophils 47 Not Estab. %   Lymphs 42 Not Estab. %   Monocytes 9 Not Estab. %   Eos 1 Not Estab. %   Basos 1 Not Estab. %   Neutrophils Absolute 2.5 1.4 - 7.0 x10E3/uL   Lymphocytes Absolute 2.3 0.7 - 3.1 x10E3/uL   Monocytes Absolute 0.5 0.1 - 0.9 x10E3/uL  EOS (ABSOLUTE) 0.1 0.0 - 0.4 x10E3/uL   Basophils Absolute 0.1 0.0 - 0.2 x10E3/uL   Immature Granulocytes 0 Not Estab. %   Immature Grans (Abs) 0.0 0.0 - 0.1 x10E3/uL  Comprehensive metabolic panel  Result Value Ref Range   Glucose 80 65 - 99 mg/dL   BUN 20 6 - 24 mg/dL   Creatinine, Ser 0.56 (L) 0.57 - 1.00 mg/dL   eGFR 118 >59 mL/min/1.73   BUN/Creatinine Ratio 36 (H) 9 - 23   Sodium 137 134 - 144 mmol/L   Potassium 4.2 3.5 - 5.2 mmol/L   Chloride 102 96 - 106 mmol/L   CO2 22 20 - 29 mmol/L   Calcium 9.3 8.7 - 10.2 mg/dL   Total Protein 6.7 6.0 - 8.5 g/dL   Albumin 4.3 3.8 - 4.8 g/dL   Globulin, Total 2.4 1.5 - 4.5 g/dL   Albumin/Globulin Ratio 1.8 1.2 - 2.2   Bilirubin Total 0.2 0.0 - 1.2 mg/dL   Alkaline Phosphatase 50 44 - 121 IU/L   AST 13 0 - 40 IU/L   ALT 11 0 - 32 IU/L  Lipid panel  Result Value Ref Range   Cholesterol, Total 201 (H) 100 - 199 mg/dL   Triglycerides 121 0 - 149 mg/dL   HDL 91 >39 mg/dL   VLDL Cholesterol Cal 21 5 - 40 mg/dL   LDL Chol Calc (NIH) 89 0 - 99 mg/dL   Chol/HDL Ratio 2.2 0.0 - 4.4 ratio  TSH  Result  Value Ref Range   TSH 2.220 0.450 - 4.500 uIU/mL  Urinalysis, Routine w reflex microscopic  Result Value Ref Range   Specific Gravity, UA 1.020 1.005 - 1.030   pH, UA 7.0 5.0 - 7.5   Color, UA Yellow Yellow   Appearance Ur Hazy (A) Clear   Leukocytes,UA Negative Negative   Protein,UA Negative Negative/Trace   Glucose, UA Negative Negative   Ketones, UA Negative Negative   RBC, UA Negative Negative   Bilirubin, UA Negative Negative   Urobilinogen, Ur 0.2 0.2 - 1.0 mg/dL   Nitrite, UA Negative Negative  HIV Antibody (routine testing w rflx)  Result Value Ref Range   HIV Screen 4th Generation wRfx Non Reactive Non Reactive  Hepatitis C Antibody  Result Value Ref Range   Hep C Virus Ab <0.1 0.0 - 0.9 s/co ratio      Assessment & Plan:   Problem List Items Addressed This Visit       Other   Depression - Primary    Chronic. Depression is well controlled.  Anxiety could be better controlled.  Will increase Wellbutrin to 385m daily.  Advised patient that this can worsen her anxiety.  However, patient is very sensitive to dose changes with Lexapro.  If anxiety worsens then will go back to 1574mand increase Lexapro.  Patient understands and agrees with the plan of care.  Follow up in 1 month for reevaluation.       Relevant Medications   buPROPion (WELLBUTRIN XL) 300 MG 24 hr tablet   escitalopram (LEXAPRO) 5 MG tablet     Follow up plan: Return in about 1 month (around 08/30/2021) for Depression/Anxiety FU (virtual).  This visit was completed via MyChart due to the restrictions of the COVID-19 pandemic. All issues as above were discussed and addressed. Physical exam was done as above through visual confirmation on MyChart. If it was felt that the patient should be evaluated in the office, they were directed there. The patient  verbally consented to this visit. Location of the patient: Home Location of the provider: Office Those involved with this call:  Provider: Jon Billings, NP CMA: None Front Desk/Registration: Myrlene Broker This encounter was conducted via video.  I spent 20 dedicated to the care of this patient on the date of this encounter to include previsit review of 30, face to face time with the patient, and post visit ordering of testing.

## 2021-07-31 NOTE — Assessment & Plan Note (Signed)
Chronic. Depression is well controlled.  Anxiety could be better controlled.  Will increase Wellbutrin to 300mg  daily.  Advised patient that this can worsen her anxiety.  However, patient is very sensitive to dose changes with Lexapro.  If anxiety worsens then will go back to 150mg  and increase Lexapro.  Patient understands and agrees with the plan of care.  Follow up in 1 month for reevaluation.

## 2021-08-01 NOTE — Progress Notes (Signed)
Pt has been scheduled.  °

## 2021-08-30 ENCOUNTER — Encounter: Payer: Self-pay | Admitting: Nurse Practitioner

## 2021-08-30 ENCOUNTER — Telehealth (INDEPENDENT_AMBULATORY_CARE_PROVIDER_SITE_OTHER): Payer: BC Managed Care – PPO | Admitting: Nurse Practitioner

## 2021-08-30 DIAGNOSIS — F330 Major depressive disorder, recurrent, mild: Secondary | ICD-10-CM

## 2021-08-30 MED ORDER — BUPROPION HCL ER (XL) 300 MG PO TB24: 300.0000 mg | ORAL_TABLET | Freq: Every day | ORAL | 1 refills | Status: DC

## 2021-08-30 NOTE — Progress Notes (Signed)
There were no vitals taken for this visit.   Subjective:    Patient ID: Tammy Harper, female    DOB: 1981-02-19, 40 y.o.   MRN: [id number]  HPI: Tammy Harper is a 40 y.o. female  Chief Complaint  Patient presents with   Depression    DEPRESSION/ANXIETY Patient states she feels like things aren't going that great. Patient states that she hasn't increased the Wellbutrin to 122m and Lexapro at 544m  She feels like the depression is worse than the anxiety.  She is okay with increase to Wellbutrin 30028m   GAD 7 : Generalized Anxiety Score 08/30/2021 07/31/2021 03/23/2020 01/05/2020  Nervous, Anxious, on Edge 2 1 0 1  Control/stop worrying 3 1 0 0  Worry too much - different things 3 1 0 0  Trouble relaxing 1 1 0 1  Restless 0 0 0 0  Easily annoyed or irritable 2 1 0 2  Afraid - awful might happen 1 0 0 0  Total GAD 7 Score 12 5 0 4  Anxiety Difficulty Somewhat difficult Not difficult at all - Somewhat difficult    Flowsheet Row Video Visit [facility] 08/30/2021 [facility] Total Score 6          Relevant past medical, surgical, family and social history reviewed and updated as indicated. Interim medical history since our last visit reviewed. Allergies and medications reviewed and updated.  Review of Systems  Psychiatric/Behavioral:  Positive for dysphoric mood. Negative for suicidal ideas. The patient is nervous/anxious.    Per HPI unless specifically indicated above     Objective:    There were no vitals taken for this visit.  Wt Readings from Last 3 Encounters:  05/03/21 150 lb (68 kg)  10/03/20 147 lb 6.4 oz (66.9 kg)  09/15/20 147 lb 6.4 oz (66.9 kg)    Physical Exam Vitals and nursing note reviewed.  HENT:     Head: Normocephalic.     Right Ear: Hearing normal.     Left Ear: Hearing normal.     Nose: Nose normal.  Eyes:     Pupils: Pupils are equal, round, and reactive to light.  Pulmonary:     Effort: Pulmonary effort  is normal. No respiratory distress.  Neurological:     Mental Status: She is alert.  Psychiatric:        Mood and Affect: Mood normal.        Behavior: Behavior normal.        Thought Content: Thought content normal.        Judgment: Judgment normal.    Results for orders placed or performed in visit on 05/03/21  CBC with Differential/Platelet  Result Value Ref Range   WBC 5.4 3.4 - 10.8 x10E3/uL   RBC 3.97 3.77 - 5.28 x10E6/uL   Hemoglobin 12.3 11.1 - 15.9 g/dL   Hematocrit 37.2 34.0 - 46.6 %   MCV 94 79 - 97 fL   MCH 31.0 26.6 - 33.0 pg   MCHC 33.1 31.5 - 35.7 g/dL   RDW 11.8 11.7 - 15.4 %   Platelets 270 150 - 450 x10E3/uL   Neutrophils 47 Not Estab. %   Lymphs 42 Not Estab. %   Monocytes 9 Not Estab. %   Eos 1 Not Estab. %   Basos 1 Not Estab. %   Neutrophils Absolute 2.5 1.4 - 7.0 x10E3/uL   Lymphocytes Absolute 2.3 0.7 - 3.1 x10E3/uL   Monocytes Absolute 0.5  0.1 - 0.9 x10E3/uL   EOS (ABSOLUTE) 0.1 0.0 - 0.4 x10E3/uL   Basophils Absolute 0.1 0.0 - 0.2 x10E3/uL   Immature Granulocytes 0 Not Estab. %   Immature Grans (Abs) 0.0 0.0 - 0.1 x10E3/uL  Comprehensive metabolic panel  Result Value Ref Range   Glucose 80 65 - 99 mg/dL   BUN 20 6 - 24 mg/dL   Creatinine, Ser 0.56 (L) 0.57 - 1.00 mg/dL   eGFR 118 >59 mL/min/1.73   BUN/Creatinine Ratio 36 (H) 9 - 23   Sodium 137 134 - 144 mmol/L   Potassium 4.2 3.5 - 5.2 mmol/L   Chloride 102 96 - 106 mmol/L   CO2 22 20 - 29 mmol/L   Calcium 9.3 8.7 - 10.2 mg/dL   Total Protein 6.7 6.0 - 8.5 g/dL   Albumin 4.3 3.8 - 4.8 g/dL   Globulin, Total 2.4 1.5 - 4.5 g/dL   Albumin/Globulin Ratio 1.8 1.2 - 2.2   Bilirubin Total 0.2 0.0 - 1.2 mg/dL   Alkaline Phosphatase 50 44 - 121 IU/L   AST 13 0 - 40 IU/L   ALT 11 0 - 32 IU/L  Lipid panel  Result Value Ref Range   Cholesterol, Total 201 (H) 100 - 199 mg/dL   Triglycerides 121 0 - 149 mg/dL   HDL 91 >39 mg/dL   VLDL Cholesterol Cal 21 5 - 40 mg/dL   LDL Chol Calc (NIH) 89 0 -  99 mg/dL   Chol/HDL Ratio 2.2 0.0 - 4.4 ratio  TSH  Result Value Ref Range   TSH 2.220 0.450 - 4.500 uIU/mL  Urinalysis, Routine w reflex microscopic  Result Value Ref Range   Specific Gravity, UA 1.020 1.005 - 1.030   pH, UA 7.0 5.0 - 7.5   Color, UA Yellow Yellow   Appearance Ur Hazy (A) Clear   Leukocytes,UA Negative Negative   Protein,UA Negative Negative/Trace   Glucose, UA Negative Negative   Ketones, UA Negative Negative   RBC, UA Negative Negative   Bilirubin, UA Negative Negative   Urobilinogen, Ur 0.2 0.2 - 1.0 mg/dL   Nitrite, UA Negative Negative  HIV Antibody (routine testing w rflx)  Result Value Ref Range   HIV Screen 4th Generation wRfx Non Reactive Non Reactive  Hepatitis C Antibody  Result Value Ref Range   Hep C Virus Ab <0.1 0.0 - 0.9 s/co ratio      Assessment & Plan:   Problem List Items Addressed This Visit       Other   Depression - Primary    Chronic. Not well controlled. Patient is taking Wellbutrin $RemoveBeforeDEI'150mg'[id number]$  and Lexapro $RemoveBef'5mg'[id number]$  daily.  Discussed several options with patient for medications including paxil and citalopram to help with night sweats.  She would like to increase the Wellbutrin due to concerns over weight gain with the other medications.  Will increase Wellbutrin to $RemoveBefor'300mg'uKmBXSHxAuOr$  daily.  Will follow up in 2 weeks for reevaluation. Patient understands and agrees with the plan of care.       Relevant Medications   buPROPion (WELLBUTRIN XL) 300 MG 24 hr tablet     Follow up plan: Return in about 2 weeks (around 09/13/2021) for Depression/Anxiety FU and PAP.  This visit was completed via MyChart due to the restrictions of the COVID-19 pandemic. All issues as above were discussed and addressed. Physical exam was done as above through visual confirmation on MyChart. If it was felt that the patient should be evaluated in the office, they were  directed there. The patient verbally consented to this visit. Location of the patient: Home Location of the  provider: Office Those involved with this call:  Provider: Jon Billings, NP CMA: None Front Desk/Registration: Myrlene Broker This encounter was conducted via video.  I spent 20 dedicated to the care of this patient on the date of this encounter to include previsit review of 30, face to face time with the patient, and post visit ordering of testing.

## 2021-08-31 ENCOUNTER — Telehealth: Payer: Self-pay | Admitting: Nurse Practitioner

## 2021-08-31 NOTE — Assessment & Plan Note (Signed)
Chronic. Not well controlled. Patient is taking Wellbutrin 150mg  and Lexapro 5mg  daily.  Discussed several options with patient for medications including paxil and citalopram to help with night sweats.  She would like to increase the Wellbutrin due to concerns over weight gain with the other medications.  Will increase Wellbutrin to 300mg  daily.  Will follow up in 2 weeks for reevaluation. Patient understands and agrees with the plan of care.

## 2021-08-31 NOTE — Telephone Encounter (Signed)
Lmom asking pt to call back to schedule a 2 week in office follow up with pcp.

## 2021-09-01 NOTE — Progress Notes (Signed)
2nd attempt- Lmom asking pt to call back to schedule 2 week fu.

## 2021-09-05 NOTE — Progress Notes (Signed)
3rd attempt- Lmom asking pt to call back to schedule an appt. °

## 2021-09-22 ENCOUNTER — Telehealth (INDEPENDENT_AMBULATORY_CARE_PROVIDER_SITE_OTHER): Payer: BC Managed Care – PPO | Admitting: Nurse Practitioner

## 2021-09-22 ENCOUNTER — Encounter: Payer: Self-pay | Admitting: Nurse Practitioner

## 2021-09-22 DIAGNOSIS — B001 Herpesviral vesicular dermatitis: Secondary | ICD-10-CM | POA: Diagnosis not present

## 2021-09-22 MED ORDER — VALACYCLOVIR HCL 1 G PO TABS: 1000.0000 mg | ORAL_TABLET | Freq: Two times a day (BID) | ORAL | 1 refills | Status: AC

## 2021-09-22 NOTE — Progress Notes (Signed)
There were no vitals taken for this visit.   Subjective:    Patient ID: Tammy Harper, female    DOB: 21-Nov-1980, 41 y.o.   MRN: [id number]  HPI: Tammy Harper is a 41 y.o. female  Chief Complaint  Patient presents with   Mouth Lesions   Patient states she has a cold sore on her mouth.  It has gotten larger and lasting longer than her normal cold sores. States it is on the corner but it is spreading down to the bottom part of her cheek.  She has been using abreva and ice but it is still spreading.   Relevant past medical, surgical, family and social history reviewed and updated as indicated. Interim medical history since our last visit reviewed. Allergies and medications reviewed and updated.  Review of Systems  HENT:  Positive for mouth sores.    Per HPI unless specifically indicated above     Objective:    There were no vitals taken for this visit.  Wt Readings from Last 3 Encounters:  05/03/21 150 lb (68 kg)  10/03/20 147 lb 6.4 oz (66.9 kg)  09/15/20 147 lb 6.4 oz (66.9 kg)    Physical Exam Vitals and nursing note reviewed.  HENT:     Head: Normocephalic.     Right Ear: Hearing normal.     Left Ear: Hearing normal.     Nose: Nose normal.  Eyes:     Pupils: Pupils are equal, round, and reactive to light.  Pulmonary:     Effort: Pulmonary effort is normal. No respiratory distress.  Neurological:     Mental Status: She is alert.  Psychiatric:        Mood and Affect: Mood normal.        Behavior: Behavior normal.        Thought Content: Thought content normal.        Judgment: Judgment normal.    Results for orders placed or performed in visit on 05/03/21  CBC with Differential/Platelet  Result Value Ref Range   WBC 5.4 3.4 - 10.8 x10E3/uL   RBC 3.97 3.77 - 5.28 x10E6/uL   Hemoglobin 12.3 11.1 - 15.9 g/dL   Hematocrit 37.2 34.0 - 46.6 %   MCV 94 79 - 97 fL   MCH 31.0 26.6 - 33.0 pg   MCHC 33.1 31.5 - 35.7 g/dL   RDW 11.8 11.7 - 15.4 %    Platelets 270 150 - 450 x10E3/uL   Neutrophils 47 Not Estab. %   Lymphs 42 Not Estab. %   Monocytes 9 Not Estab. %   Eos 1 Not Estab. %   Basos 1 Not Estab. %   Neutrophils Absolute 2.5 1.4 - 7.0 x10E3/uL   Lymphocytes Absolute 2.3 0.7 - 3.1 x10E3/uL   Monocytes Absolute 0.5 0.1 - 0.9 x10E3/uL   EOS (ABSOLUTE) 0.1 0.0 - 0.4 x10E3/uL   Basophils Absolute 0.1 0.0 - 0.2 x10E3/uL   Immature Granulocytes 0 Not Estab. %   Immature Grans (Abs) 0.0 0.0 - 0.1 x10E3/uL  Comprehensive metabolic panel  Result Value Ref Range   Glucose 80 65 - 99 mg/dL   BUN 20 6 - 24 mg/dL   Creatinine, Ser 0.56 (L) 0.57 - 1.00 mg/dL   eGFR 118 >59 mL/min/1.73   BUN/Creatinine Ratio 36 (H) 9 - 23   Sodium 137 134 - 144 mmol/L   Potassium 4.2 3.5 - 5.2 mmol/L   Chloride 102 96 - 106 mmol/L   CO2  22 20 - 29 mmol/L   Calcium 9.3 8.7 - 10.2 mg/dL   Total Protein 6.7 6.0 - 8.5 g/dL   Albumin 4.3 3.8 - 4.8 g/dL   Globulin, Total 2.4 1.5 - 4.5 g/dL   Albumin/Globulin Ratio 1.8 1.2 - 2.2   Bilirubin Total 0.2 0.0 - 1.2 mg/dL   Alkaline Phosphatase 50 44 - 121 IU/L   AST 13 0 - 40 IU/L   ALT 11 0 - 32 IU/L  Lipid panel  Result Value Ref Range   Cholesterol, Total 201 (H) 100 - 199 mg/dL   Triglycerides 121 0 - 149 mg/dL   HDL 91 >39 mg/dL   VLDL Cholesterol Cal 21 5 - 40 mg/dL   LDL Chol Calc (NIH) 89 0 - 99 mg/dL   Chol/HDL Ratio 2.2 0.0 - 4.4 ratio  TSH  Result Value Ref Range   TSH 2.220 0.450 - 4.500 uIU/mL  Urinalysis, Routine w reflex microscopic  Result Value Ref Range   Specific Gravity, UA 1.020 1.005 - 1.030   pH, UA 7.0 5.0 - 7.5   Color, UA Yellow Yellow   Appearance Ur Hazy (A) Clear   Leukocytes,UA Negative Negative   Protein,UA Negative Negative/Trace   Glucose, UA Negative Negative   Ketones, UA Negative Negative   RBC, UA Negative Negative   Bilirubin, UA Negative Negative   Urobilinogen, Ur 0.2 0.2 - 1.0 mg/dL   Nitrite, UA Negative Negative  HIV Antibody (routine testing w  rflx)  Result Value Ref Range   HIV Screen 4th Generation wRfx Non Reactive Non Reactive  Hepatitis C Antibody  Result Value Ref Range   Hep C Virus Ab <0.1 0.0 - 0.9 s/co ratio      Assessment & Plan:   Problem List Items Addressed This Visit   None Visit Diagnoses     Cold sore    -  Primary   Will treat with Valtrex. Discussed how to properly use the medication. Discussed how to use with future cold sores. FU if symptoms do not improve.   Relevant Medications   valACYclovir (VALTREX) 1000 MG tablet        Follow up plan: No follow-ups on file.   This visit was completed via MyChart due to the restrictions of the COVID-19 pandemic. All issues as above were discussed and addressed. Physical exam was done as above through visual confirmation on MyChart. If it was felt that the patient should be evaluated in the office, they were directed there. The patient verbally consented to this visit. Location of the patient: Home Location of the provider: Office Those involved with this call:  Provider: Jon Billings, NP CMA: Yvonna Alanis, [place] Desk/Registration: Myrlene Broker This encounter was conducted via video.  I spent 15 dedicated to the care of this patient on the date of this encounter to include previsit review of 20, face to face time with the patient, and post visit ordering of testing.

## 2021-10-19 ENCOUNTER — Encounter: Payer: Self-pay | Admitting: Nurse Practitioner

## 2021-10-19 ENCOUNTER — Telehealth (INDEPENDENT_AMBULATORY_CARE_PROVIDER_SITE_OTHER): Payer: BC Managed Care – PPO | Admitting: Nurse Practitioner

## 2021-10-19 ENCOUNTER — Ambulatory Visit: Payer: Self-pay | Admitting: *Deleted

## 2021-10-19 DIAGNOSIS — R21 Rash and other nonspecific skin eruption: Secondary | ICD-10-CM

## 2021-10-19 MED ORDER — METHYLPREDNISOLONE 4 MG PO TBPK: ORAL_TABLET | ORAL | 0 refills | Status: DC

## 2021-10-19 NOTE — Progress Notes (Signed)
There were no vitals taken for this visit.   Subjective:    Patient ID: Tammy Harper, female    DOB: 12-30-1980, 41 y.o.   MRN: [id number]  HPI: Tammy Harper is a 41 y.o. female  Chief Complaint  Patient presents with   Rash    Pt states she started having a rash on her face about a week and a half ago. States it is getting worse and starting to be hives. States it does not itch or burn.    RASH Duration:  weeks - about a week- week 1/2 Location: face  Itching: no Burning: no Redness: yes Oozing: no Scaling: no Blisters: no Painful: no Fevers: no Change in detergents/soaps/personal care products: no Recent illness: no Recent travel:no History of same: no Context: worse Alleviating factors:  none Treatments attempted: vaseline, benadryl- does help so it is not as "flared" Shortness of breath: no  Throat/tongue swelling: no Myalgias/arthralgias: no  Relevant past medical, surgical, family and social history reviewed and updated as indicated. Interim medical history since our last visit reviewed. Allergies and medications reviewed and updated.  Review of Systems  Skin:  Positive for rash.   Per HPI unless specifically indicated above     Objective:    There were no vitals taken for this visit.  Wt Readings from Last 3 Encounters:  05/03/21 150 lb (68 kg)  10/03/20 147 lb 6.4 oz (66.9 kg)  09/15/20 147 lb 6.4 oz (66.9 kg)    Physical Exam Vitals and nursing note reviewed.  HENT:     Head: Normocephalic.     Right Ear: Hearing normal.     Left Ear: Hearing normal.     Nose: Nose normal.  Eyes:     Pupils: Pupils are equal, round, and reactive to light.  Pulmonary:     Effort: Pulmonary effort is normal. No respiratory distress.  Skin:      Neurological:     Mental Status: She is alert.  Psychiatric:        Mood and Affect: Mood normal.        Behavior: Behavior normal.        Thought Content: Thought content normal.        Judgment:  Judgment normal.    Results for orders placed or performed in visit on 05/03/21  CBC with Differential/Platelet  Result Value Ref Range   WBC 5.4 3.4 - 10.8 x10E3/uL   RBC 3.97 3.77 - 5.28 x10E6/uL   Hemoglobin 12.3 11.1 - 15.9 g/dL   Hematocrit 37.2 34.0 - 46.6 %   MCV 94 79 - 97 fL   MCH 31.0 26.6 - 33.0 pg   MCHC 33.1 31.5 - 35.7 g/dL   RDW 11.8 11.7 - 15.4 %   Platelets 270 150 - 450 x10E3/uL   Neutrophils 47 Not Estab. %   Lymphs 42 Not Estab. %   Monocytes 9 Not Estab. %   Eos 1 Not Estab. %   Basos 1 Not Estab. %   Neutrophils Absolute 2.5 1.4 - 7.0 x10E3/uL   Lymphocytes Absolute 2.3 0.7 - 3.1 x10E3/uL   Monocytes Absolute 0.5 0.1 - 0.9 x10E3/uL   EOS (ABSOLUTE) 0.1 0.0 - 0.4 x10E3/uL   Basophils Absolute 0.1 0.0 - 0.2 x10E3/uL   Immature Granulocytes 0 Not Estab. %   Immature Grans (Abs) 0.0 0.0 - 0.1 x10E3/uL  Comprehensive metabolic panel  Result Value Ref Range   Glucose 80 65 - 99 mg/dL  BUN 20 6 - 24 mg/dL   Creatinine, Ser 0.56 (L) 0.57 - 1.00 mg/dL   eGFR 118 >59 mL/min/1.73   BUN/Creatinine Ratio 36 (H) 9 - 23   Sodium 137 134 - 144 mmol/L   Potassium 4.2 3.5 - 5.2 mmol/L   Chloride 102 96 - 106 mmol/L   CO2 22 20 - 29 mmol/L   Calcium 9.3 8.7 - 10.2 mg/dL   Total Protein 6.7 6.0 - 8.5 g/dL   Albumin 4.3 3.8 - 4.8 g/dL   Globulin, Total 2.4 1.5 - 4.5 g/dL   Albumin/Globulin Ratio 1.8 1.2 - 2.2   Bilirubin Total 0.2 0.0 - 1.2 mg/dL   Alkaline Phosphatase 50 44 - 121 IU/L   AST 13 0 - 40 IU/L   ALT 11 0 - 32 IU/L  Lipid panel  Result Value Ref Range   Cholesterol, Total 201 (H) 100 - 199 mg/dL   Triglycerides 121 0 - 149 mg/dL   HDL 91 >39 mg/dL   VLDL Cholesterol Cal 21 5 - 40 mg/dL   LDL Chol Calc (NIH) 89 0 - 99 mg/dL   Chol/HDL Ratio 2.2 0.0 - 4.4 ratio  TSH  Result Value Ref Range   TSH 2.220 0.450 - 4.500 uIU/mL  Urinalysis, Routine w reflex microscopic  Result Value Ref Range   Specific Gravity, UA 1.020 1.005 - 1.030   pH, UA 7.0  5.0 - 7.5   Color, UA Yellow Yellow   Appearance Ur Hazy (A) Clear   Leukocytes,UA Negative Negative   Protein,UA Negative Negative/Trace   Glucose, UA Negative Negative   Ketones, UA Negative Negative   RBC, UA Negative Negative   Bilirubin, UA Negative Negative   Urobilinogen, Ur 0.2 0.2 - 1.0 mg/dL   Nitrite, UA Negative Negative  HIV Antibody (routine testing w rflx)  Result Value Ref Range   HIV Screen 4th Generation wRfx Non Reactive Non Reactive  Hepatitis C Antibody  Result Value Ref Range   Hep C Virus Ab <0.1 0.0 - 0.9 s/co ratio      Assessment & Plan:   Problem List Items Addressed This Visit   None Visit Diagnoses     Rash of face    -  Primary   Will treat with Medrol dose pak. Recommend benadryl at bedtime.  Follow up if symptoms do not improve.        Follow up plan: Return if symptoms worsen or fail to improve.   This visit was completed via MyChart due to the restrictions of the COVID-19 pandemic. All issues as above were discussed and addressed. Physical exam was done as above through visual confirmation on MyChart. If it was felt that the patient should be evaluated in the office, they were directed there. The patient verbally consented to this visit. Location of the patient: Home Location of the provider: Office Those involved with this call:  Provider: Jon Billings, NP CMA: Yvonna Alanis, [place] Desk/Registration: Myrlene Broker This encounter was conducted via video.  I spent 20 dedicated to the care of this patient on the date of this encounter to include previsit review of rash and treatment, face to face time with the patient, and post visit ordering of testing.

## 2021-10-19 NOTE — Telephone Encounter (Signed)
° ° °  Chief Complaint: allergic reaction , requesting appt  Symptoms: red raised hives to right side of face top to bottom. Raised patches on cheek tiny pimples.  Frequency: x 1 week  Pertinent Negatives: Patient denies difficulty breathing, no tongue swelling , no face swelling , no drainage Disposition: [] ED /[] Urgent Care (no appt availability in office) / [x] Appointment(In office/virtual)/ []  [place]/ [] Home Care/ [] Refused Recommended Disposition /[] [place] [facility]/ []  Follow-up with PCP Additional Notes:   VV today .   Reason for Disposition  [1] MODERATE-SEVERE hives persist (i.e., hives interfere with normal activities or work) AND [2] taking antihistamine (e.g., Benadryl, Claritin) > 24 hours  Answer Assessment - Initial Assessment Questions 1. APPEARANCE: "What does the rash look like?"      Red raised some patches , tiny pimple like bumps  2. LOCATION: "Where is the rash located?"      Right side of face top to bottom  3. NUMBER: "How many hives are there?"      Na  4. SIZE: "How big are the hives?" (inches, cm, compare to coins) "Do they all look the same or is there lots of variation in shape and size?"      na 5. ONSET: "When did the hives begin?" (Hours or days ago)      X 1 week  6. ITCHING: "Does it itch?" If Yes, ask: "How bad is the itch?"    - MILD: doesn't interfere with normal activities   - MODERATE-SEVERE: interferes with work, school, sleep, or other activities      No  7. RECURRENT PROBLEM: "Have you had hives before?" If Yes, ask: "When was the last time?" and "What happened that time?"      na 8. TRIGGERS: "Were you exposed to any new food, plant, cosmetic product or animal just before the hives began?"     Possible self tanner 9. OTHER SYMPTOMS: "Do you have any other symptoms?" (e.g., fever, tongue swelling, difficulty breathing, abdominal pain)     Denies  10. PREGNANCY: "Is there any chance you are pregnant?" "When was your last  menstrual period?"       na  Protocols used: Hives-A-AH

## 2021-10-31 ENCOUNTER — Other Ambulatory Visit: Payer: Self-pay | Admitting: Nurse Practitioner

## 2021-11-01 NOTE — Telephone Encounter (Signed)
Smoking was addressed during LOV.  Advised not to smoke but did not say if she was a smoker or not.   No indication that she was in notes so a 3 month supply given. ? ?Requested Prescriptions  ?Pending Prescriptions Disp Refills  ?? drospirenone-ethinyl estradiol (YASMIN) 3-0.03 MG tablet [Pharmacy Med Name: DROSPIRENONE-ETHINYL 3-0.03MG  TABS] 84 tablet   ?  Sig: TAKE 1 TABLET BY MOUTH DAILY  ?  ? OB/GYN:  Contraceptives Failed - 10/31/2021 11:25 AM  ?  ?  Failed - Patient is not a smoker  ?  ?  Passed - Last BP in normal range  ?  BP Readings from Last 1 Encounters:  ?05/03/21 122/69  ?   ?  ?  Passed - Valid encounter within last 12 months  ?  Recent Outpatient Visits   ?      ? 1 week ago Rash of face  ? [place], NP  ? 1 month ago Cold sore  ? [place], NP  ? 2 months ago Mild episode of recurrent major depressive disorder ([place])  ? [place], NP  ? 3 months ago Mild episode of recurrent major depressive disorder ([place])  ? [place], NP  ? 6 months ago Annual physical exam  ? [facility] Jon Billings, NP  ?  ?  ? ?  ?  ?  ? ?

## 2022-01-02 ENCOUNTER — Ambulatory Visit: Payer: BC Managed Care – PPO | Admitting: Nurse Practitioner

## 2022-01-02 ENCOUNTER — Encounter: Payer: Self-pay | Admitting: Nurse Practitioner

## 2022-01-02 VITALS — BP 131/76 | HR 64 | Temp 99.0°F | Wt 163.4 lb

## 2022-01-02 DIAGNOSIS — R1031 Right lower quadrant pain: Secondary | ICD-10-CM | POA: Insufficient documentation

## 2022-01-02 DIAGNOSIS — R8281 Pyuria: Secondary | ICD-10-CM | POA: Diagnosis not present

## 2022-01-02 DIAGNOSIS — R1011 Right upper quadrant pain: Secondary | ICD-10-CM | POA: Diagnosis not present

## 2022-01-02 LAB — URINALYSIS, ROUTINE W REFLEX MICROSCOPIC
Bilirubin, UA: NEGATIVE
Glucose, UA: NEGATIVE
Leukocytes,UA: NEGATIVE
Nitrite, UA: NEGATIVE
Protein,UA: NEGATIVE
Specific Gravity, UA: >1.03 — ABNORMAL HIGH (ref 1.005–1.030)
Urobilinogen, Ur: 0.2 mg/dL (ref 0.2–1.0)
pH, UA: 5.5 (ref 5.0–7.5)

## 2022-01-02 LAB — MICROSCOPIC EXAMINATION: WBC, UA: NONE SEEN /hpf (ref 0–5)

## 2022-01-02 LAB — WET PREP FOR TRICH, YEAST, CLUE
Clue Cell Exam: NEGATIVE
Trichomonas Exam: NEGATIVE
Yeast Exam: NEGATIVE

## 2022-01-02 MED ORDER — KETOROLAC TROMETHAMINE 60 MG/2ML IM SOLN
60.0000 mg | Freq: Once | INTRAMUSCULAR | Status: AC
  Administered 2022-01-02: 60 mg via INTRAMUSCULAR

## 2022-01-02 MED ORDER — KETOROLAC TROMETHAMINE 10 MG PO TABS: 10.0000 mg | ORAL_TABLET | Freq: Four times a day (QID) | ORAL | 0 refills | Status: DC | PRN

## 2022-01-02 NOTE — Progress Notes (Signed)
? ?BP 131/76   Pulse 64   Temp 99 ?F (37.2 ?C) (Oral)   Wt 163 lb 6.4 oz (74.1 kg)   SpO2 98%   BMI 28.10 kg/m?   ? ?Subjective:  ? ? Patient ID: Tammy Harper, female    DOB: 12-Mar-1981, 41 y.o.   MRN: [id number] ? ?HPI: ?Tammy Harper is a 41 y.o. female ? ?Chief Complaint  ?Patient presents with  ? Flank Pain  ?  Patient states the pain is located in her right side of her abdominal area. Patient states the pain was typically at night, but today the pain has been constant all day. Patient states she has tried Tylenol over the counter this morning to help with pain. Patient states the pain first started Sunday. Patient states last Monday, is when she noticed pain after eating.   ? ?ABDOMINAL PAIN  ?To right upper quadrant along side and into back.  Last Monday started feeling "yuck" with her stomach.  Started a new green, called AG1, which she reports she stopped due to possible GI issues with this.  Severity started on Sunday.  After she eats pain becomes worse.   ? ?Still has gall bladder and no history of abdominal surgeries.   ?Duration:days ?Onset: gradual ?Severity: 8/10 ?Quality: dull, stabbing, and throbbing ?Location:  RUQ  ?Episode duration:  ?Radiation: yes ?Frequency: constant today, last night had wings and shrimp on grill and Sunday had pizza ?Alleviating factors: Tylenol ?Aggravating factors: eating ?Status: worse ?Treatments attempted: Tylenol and hot bath ?Fever: no ?Nausea:  occasional ?Vomiting: no ?Weight loss: no ?Decreased appetite:  only because is scared to eat ?Diarrhea: no ?Constipation: no ?Blood in stool: no ?Heartburn: no ?Jaundice: no ?Rash: no ?Dysuria/urinary frequency: no ?Hematuria: no ?History of sexually transmitted disease: no ?Recurrent NSAID use: often take this daily ? ?Relevant past medical, surgical, family and social history reviewed and updated as indicated. Interim medical history since our last visit reviewed. ?Allergies and medications reviewed and  updated. ? ?Review of Systems  ?Constitutional:  Negative for activity change, appetite change, diaphoresis, fatigue and fever.  ?Respiratory:  Negative for cough, chest tightness and shortness of breath.   ?Cardiovascular:  Negative for chest pain, palpitations and leg swelling.  ?Gastrointestinal:  Positive for abdominal pain. Negative for abdominal distention, blood in stool, constipation, diarrhea, nausea and vomiting.  ?Neurological: Negative.   ?Psychiatric/Behavioral: Negative.    ? ?Per HPI unless specifically indicated above ? ?   ?Objective:  ?  ?BP 131/76   Pulse 64   Temp 99 ?F (37.2 ?C) (Oral)   Wt 163 lb 6.4 oz (74.1 kg)   SpO2 98%   BMI 28.10 kg/m?   ?Wt Readings from Last 3 Encounters:  ?01/02/22 163 lb 6.4 oz (74.1 kg)  ?05/03/21 150 lb (68 kg)  ?10/03/20 147 lb 6.4 oz (66.9 kg)  ?  ?Physical Exam ?Vitals and nursing note reviewed.  ?Constitutional:   ?   General: She is awake. She is not in acute distress. ?   Appearance: She is well-developed and well-groomed. She is not ill-appearing or toxic-appearing.  ?HENT:  ?   Head: Normocephalic.  ?   Right Ear: Hearing normal.  ?   Left Ear: Hearing normal.  ?Eyes:  ?   General: Lids are normal.     ?   Right eye: No discharge.     ?   Left eye: No discharge.  ?   Conjunctiva/sclera: Conjunctivae normal.  ?   Pupils:  Pupils are equal, round, and reactive to light.  ?Neck:  ?   Thyroid: No thyromegaly.  ?   Vascular: No carotid bruit.  ?Cardiovascular:  ?   Rate and Rhythm: Normal rate and regular rhythm.  ?   Heart sounds: Normal heart sounds. No murmur heard. ?  No gallop.  ?Pulmonary:  ?   Effort: Pulmonary effort is normal. No accessory muscle usage or respiratory distress.  ?   Breath sounds: Normal breath sounds.  ?Abdominal:  ?   General: Bowel sounds are normal. There is no distension.  ?   Palpations: Abdomen is soft. There is no hepatomegaly.  ?   Tenderness: There is abdominal tenderness in the right upper quadrant. There is guarding. There  is no right CVA tenderness or left CVA tenderness. Positive signs include Murphy's sign.  ?   Comments: Exquisite tenderness to right upper quadrant with even light palpation, deeper palpation difficult to perform.    ?Musculoskeletal:  ?   Cervical back: Normal range of motion and neck supple.  ?   Right lower leg: No edema.  ?   Left lower leg: No edema.  ?Skin: ?   General: Skin is warm and dry.  ?Neurological:  ?   Mental Status: She is alert and oriented to person, place, and time.  ?Psychiatric:     ?   Attention and Perception: Attention normal.     ?   Mood and Affect: Mood normal.     ?   Speech: Speech normal.     ?   Behavior: Behavior normal. Behavior is cooperative.     ?   Thought Content: Thought content normal.  ? ?Results for orders placed or performed in visit on 01/02/22  ?Microscopic Examination  ?Result Value Ref Range  ? WBC, UA None seen 0 - 5 /hpf  ? RBC 0-2 0 - 2 /hpf  ? Epithelial Cells (non renal) 0-10 0 - 10 /hpf  ? Mucus, UA Present (A) Not Estab.  ? Bacteria, UA Moderate (A) None seen/Few  ?WET PREP FOR [place], YEAST, CLUE  ? Urine  ?Result Value Ref Range  ? Trichomonas Exam Negative Negative  ? Yeast Exam Negative Negative  ? Clue Cell Exam Negative Negative  ?Urinalysis, Routine w reflex microscopic  ?Result Value Ref Range  ? Specific Gravity, UA >1.030 (H) 1.005 - 1.030  ? pH, UA 5.5 5.0 - 7.5  ? Color, UA Yellow Yellow  ? Appearance Ur Clear Clear  ? Leukocytes,UA Negative Negative  ? Protein,UA Negative Negative/Trace  ? Glucose, UA Negative Negative  ? Ketones, UA Trace (A) Negative  ? RBC, UA Trace (A) Negative  ? Bilirubin, UA Negative Negative  ? Urobilinogen, Ur 0.2 0.2 - 1.0 mg/dL  ? Nitrite, UA Negative Negative  ? Microscopic Examination See below:   ? ?   ?Assessment & Plan:  ? ?Problem List Items Addressed This Visit   ? ?  ? Other  ? RUQ pain - Primary  ?  Acute for 2 days with worsening, does still have gall bladder, high suspicion for cholecystitis.  At this time will  obtain STAT RUQ ultrasound and labs today CMP, CBC, GGT, Lipase, Amylase.  Wet prep negative today and UA overall reassuring.  She has no concerns for pregnancy.  Toradol shot provided in office today for pain control.  Script for Toradol sent home with patient, as she prefers to avoid opioids for pain relief.  Discussed plan with patient -- if  imaging returns positive will send to general surgery.  If worsening pain this evening or worsening symptoms immediately go to ER.   ? ?  ?  ? Relevant Orders  ? Urinalysis, Routine w reflex microscopic (Completed)  ? WET PREP FOR TRICH, YEAST, CLUE  ? US Abdomen Limited RUQ (LIVER/GB)  ? CBC with Differential/Platelet  ? Comprehensive metabolic panel  ? Gamma GT  ? Lipase  ? Amylase  ? ?Other Visit Diagnoses   ? ? Pyuria      ? Will send in urine culture.  ? Relevant Orders  ? Urine Culture  ? ?  ?  ? ?Follow up plan: ?Return in about 3 days (around 01/05/2022) for RUQ Pain. ? ? ? ? ? ?

## 2022-01-02 NOTE — Patient Instructions (Signed)
Cholecystitis Cholecystitis is irritation and swelling (inflammation) of the gallbladder. The gallbladder: Is an organ that is shaped like a pear. Is under the liver on the right side of the body. Stores bile. Bile helps the body break down (digest) the fats in food. This condition can occur all of a sudden. It needs to be treated. What are the causes? This condition may be caused by stones or lumps that form in the gallbladder (gallstones). Gallstones can block the tube (duct) that carries bile out of your gallbladder. Other causes include: Damage to the gallbladder due to less blood flow. Germs in the bile ducts. Scars, kinks, or adhesions in the bile ducts. Abnormal growths (tumors) in the liver, pancreas, or gallbladder. What increases the risk? You are more likely to develop this condition if: You are female and between the ages of 55-62. You take birth control pills. You use estrogen. You take certain medicines that make you more likely to develop gallstones. You are overweight (obese). You have a very bad reaction to an infection (sepsis). You have been hospitalized due to a serious condition, such as a burn or illness. You have not eaten or drank for a [place] time. What are the signs or symptoms? Symptoms of this condition include: Pain in the upper right part of the belly (abdomen). A lump over the gallbladder. Bloating in the belly. Feeling sick to your stomach (nauseous). Vomiting. Fever. Chills. How is this treated? This condition may be treated with: Medicines to treat pain. Giving fluids through an IV tube. Not eating or drinking (fasting). Antibiotic medicines. Surgery to take out your gallbladder. Gallbladder drainage. Follow these instructions at home: Medicines  Take over-the-counter and prescription medicines only as told by your doctor. If you were prescribed an antibiotic medicine, take it as told by your doctor. Do not stop taking it even if you start  to feel better. General instructions Follow instructions from your doctor about what to eat or drink. Do not eat or drink anything that makes you sick again. Do not smoke or use any products that contain nicotine or tobacco. If you need help quitting, ask your doctor. Keep all follow-up visits. Contact a doctor if: You have pain and your medicine does not help. You have a fever. Get help right away if: Your pain moves to: Another part of your belly. Your back. Your symptoms do not go away. You have new symptoms. These symptoms may be an emergency. Get help right away. Call 911. Do not wait to see if the symptoms will go away. Do not drive yourself to the hospital. Summary This condition may be caused by stones or lumps that form in the gallbladder (gallstones). A common symptom is pain in your belly. This condition may be treated with surgery to take out your gallbladder. Follow instructions from your doctor about what to eat or drink. This information is not intended to replace advice given to you by your health care provider. Make sure you discuss any questions you have with your health care provider. Document Revised: 02/21/2021 Document Reviewed: 02/21/2021 Elsevier Patient Education  2023 Elsevier Inc.  

## 2022-01-02 NOTE — Assessment & Plan Note (Signed)
Acute for 2 days with worsening, does still have gall bladder, high suspicion for cholecystitis.  At this time will obtain STAT RUQ ultrasound and labs today CMP, CBC, GGT, Lipase, Amylase.  Wet prep negative today and UA overall reassuring.  She has no concerns for pregnancy.  Toradol shot provided in office today for pain control.  Script for Toradol sent home with patient, as she prefers to avoid opioids for pain relief.  Discussed plan with patient -- if imaging returns positive will send to general surgery.  If worsening pain this evening or worsening symptoms immediately go to ER.   ?

## 2022-01-03 ENCOUNTER — Telehealth: Payer: Self-pay | Admitting: Nurse Practitioner

## 2022-01-03 ENCOUNTER — Ambulatory Visit
Admission: RE | Admit: 2022-01-03 | Discharge: 2022-01-03 | Disposition: A | Discharge status: Home / Self Care | Payer: BC Managed Care – PPO | Source: Ambulatory Visit | Attending: Nurse Practitioner | Admitting: Nurse Practitioner

## 2022-01-03 DIAGNOSIS — R1011 Right upper quadrant pain: Secondary | ICD-10-CM | POA: Insufficient documentation

## 2022-01-03 LAB — GAMMA GT: GGT: 14 IU/L (ref 0–60)

## 2022-01-03 LAB — CBC WITH DIFFERENTIAL/PLATELET
Basophils Absolute: 0 10*3/uL (ref 0.0–0.2)
Basos: 0 %
EOS (ABSOLUTE): 0.1 10*3/uL (ref 0.0–0.4)
Eos: 1 %
Hematocrit: 37 % (ref 34.0–46.6)
Hemoglobin: 12.4 g/dL (ref 11.1–15.9)
Immature Grans (Abs): 0 10*3/uL (ref 0.0–0.1)
Immature Granulocytes: 0 %
Lymphocytes Absolute: 3.5 10*3/uL — ABNORMAL HIGH (ref 0.7–3.1)
Lymphs: 35 %
MCH: 31 pg (ref 26.6–33.0)
MCHC: 33.5 g/dL (ref 31.5–35.7)
MCV: 93 fL (ref 79–97)
Monocytes Absolute: 0.5 10*3/uL (ref 0.1–0.9)
Monocytes: 5 %
Neutrophils Absolute: 6 10*3/uL (ref 1.4–7.0)
Neutrophils: 59 %
Platelets: 318 10*3/uL (ref 150–450)
RBC: 4 x10E6/uL (ref 3.77–5.28)
RDW: 11.8 % (ref 11.7–15.4)
WBC: 10.2 10*3/uL (ref 3.4–10.8)

## 2022-01-03 LAB — COMPREHENSIVE METABOLIC PANEL
ALT: 18 IU/L (ref 0–32)
AST: 18 IU/L (ref 0–40)
Albumin/Globulin Ratio: 2.1 (ref 1.2–2.2)
Albumin: 4.4 g/dL (ref 3.8–4.8)
Alkaline Phosphatase: 59 IU/L (ref 44–121)
BUN/Creatinine Ratio: 15 (ref 9–23)
BUN: 11 mg/dL (ref 6–24)
Bilirubin Total: <0.2 mg/dL (ref 0.0–1.2)
CO2: 25 mmol/L (ref 20–29)
Calcium: 9.3 mg/dL (ref 8.7–10.2)
Chloride: 102 mmol/L (ref 96–106)
Creatinine, Ser: 0.72 mg/dL (ref 0.57–1.00)
Globulin, Total: 2.1 g/dL (ref 1.5–4.5)
Glucose: 84 mg/dL (ref 70–99)
Potassium: 3.7 mmol/L (ref 3.5–5.2)
Sodium: 138 mmol/L (ref 134–144)
Total Protein: 6.5 g/dL (ref 6.0–8.5)
eGFR: 108 mL/min/{1.73_m2} (ref >59)

## 2022-01-03 LAB — AMYLASE: Amylase: 38 U/L (ref 31–110)

## 2022-01-03 LAB — LIPASE: Lipase: 30 U/L (ref 14–72)

## 2022-01-03 NOTE — Telephone Encounter (Signed)
Copied from [place] [phone]. Topic: General - Other ?>> Jan 03, 2022  2:46 PM Valere Dross wrote: ?Reason for CRM: Pt called in stating she wanted to speak with a PCP about her results that came back about her gall bladder, and requested a call back to talk about those, please advise. ?

## 2022-01-03 NOTE — Progress Notes (Signed)
Contacted via [place] ? ? ?Good afternoon Anderson Malta, your gall bladder labs -- GGT and Alkaline Phosphatase + liver function (AST and ALT) are all normal.  CBC shows no signs of infection.  Pancreas labs, amylase and lipase, are normal.  Plus they reported your imaging returned normal.  Did it hurt today when they did imaging and pushed on belly?  I had high suspicion for gall bladder based on exam, but all of this is normal.  If pain continues next step would be CT scan of belly.  How are you feeling? How is pain today?  Is Toradol helping?  My other thought is sending in a low dose muscle relaxer as I wonder if this is more muscle pain. ?Keep being amazing!!  Thank you for allowing me to participate in your care.  I appreciate you. ?Kindest regards, ?Damar Petit ?

## 2022-01-04 NOTE — Progress Notes (Signed)
Good afternoon Anderson Malta, your gall bladder labs -- GGT and Alkaline Phosphatase + liver function (AST and ALT) are all normal. ?CBC shows no signs of infection. ?Pancreas labs, amylase and lipase, are normal. ?Plus they reported your imaging returned normal. ?Did it hurt today when they did imaging and pushed on belly? ?I had high suspicion for gall bladder based on exam, but all of this is normal. ?If pain continues next step would be CT scan of belly. ?How are you feeling? How is pain today? ?Is Toradol helping? ?My other thought is sending in a low dose muscle relaxer as I wonder if this is more muscle pain. ?Keep being amazing!! ?Thank you for allowing me to participate in your care. ?I appreciate you. ?Kindest regards, ?Kishana Battey

## 2022-01-04 NOTE — Telephone Encounter (Signed)
Spoke with patient regarding message below. Patient states she is still in a lot of pain, primarily after eating at night typically . Denies diarrhea, N/V. Patient states Toradol is helping and she mentioned that she was using a green supplement powder which is when the pain started but she has now stopped using it but pain is persistent. Patient would like to know what the next steps are for her. ?

## 2022-01-04 NOTE — Telephone Encounter (Signed)
LVMTRC 

## 2022-01-05 ENCOUNTER — Ambulatory Visit: Payer: BC Managed Care – PPO | Admitting: Nurse Practitioner

## 2022-01-05 ENCOUNTER — Telehealth: Payer: Self-pay | Admitting: Nurse Practitioner

## 2022-01-05 LAB — URINE CULTURE

## 2022-01-05 NOTE — Telephone Encounter (Signed)
Patient missed appointment this afternoon, was to follow-up on abdominal pain and next steps.  Have written to patient via MyChart with no response.  Called this afternoon and left general message since no answer to check in since missed appointment.   She is to call and reschedule if ongoing pain. ?

## 2022-01-06 ENCOUNTER — Telehealth: Payer: BC Managed Care – PPO | Admitting: Nurse Practitioner

## 2022-01-06 DIAGNOSIS — K269 Duodenal ulcer, unspecified as acute or chronic, without hemorrhage or perforation: Secondary | ICD-10-CM | POA: Diagnosis not present

## 2022-01-06 MED ORDER — LANSOPRAZOLE 30 MG PO CPDR: 30.0000 mg | DELAYED_RELEASE_CAPSULE | Freq: Every day | ORAL | 1 refills | Status: DC

## 2022-01-06 NOTE — Progress Notes (Signed)
? ?Virtual Visit Consent  ? ?[place], you are scheduled for a virtual visit with Mary-Margaret Hassell Done, [place], a [facility] provider, today.   ?  ?Just as with appointments in the office, your consent must be obtained to participate.  Your consent will be active for this visit and any virtual visit you may have with one of our providers in the next 365 days.   ?  ?If you have a MyChart account, a copy of this consent can be sent to you electronically.  All virtual visits are billed to your insurance company just like a traditional visit in the office.   ? ?As this is a virtual visit, video technology does not allow for your provider to perform a traditional examination.  This may limit your provider's ability to fully assess your condition.  If your provider identifies any concerns that need to be evaluated in person or the need to arrange testing (such as labs, EKG, etc.), we will make arrangements to do so.   ?  ?Although advances in technology are sophisticated, we cannot ensure that it will always work on either your end or our end.  If the connection with a video visit is poor, the visit may have to be switched to a telephone visit.  With either a video or telephone visit, we are not always able to ensure that we have a secure connection.    ? ?I need to obtain your verbal consent now.   Are you willing to proceed with your visit today? YES ?  ?Tammy Harper has provided verbal consent on 01/06/2022 for a virtual visit (video or telephone). ?  ?Mary-Margaret Hassell Done, FNP  ? ?Date: 01/06/2022 1:30 PM ? ? ?Virtual Visit via Video Note  ? ?I, Mary-Margaret Hassell Done, connected with Tammy Harper ([id number], 07-02-1981) on 01/06/22 at  1:30 PM EDT by a video-enabled telemedicine application and verified that I am speaking with the correct person using two identifiers. ? ?Location: ?Patient: Virtual Visit Location Patient: Home ?Provider: Virtual Visit Location Provider: Mobile ?  ?I discussed the  limitations of evaluation and management by telemedicine and the availability of in person appointments. The patient expressed understanding and agreed to proceed.   ? ?History of Present Illness: ?Tammy Harper is a 41 y.o. who identifies as a female who was assigned female at birth, and is being seen today for ulcer. ? ?HPI: Patient has been having RUQ pain for over a week. She has had GB U/S, as well as abdominal scan. Both of those were negative. Urine is clear. She rates pain 5/10 but after eating it goes to 10/10 and last for several hours. Worse when she eats spicy foods. She cant exercise, or  lay on her right side due to pain. Bowel movements are normal. ?  ?Review of Systems  ?Gastrointestinal:  Positive for abdominal pain (RUQ). Negative for constipation, diarrhea, nausea and vomiting.  ?All other systems reviewed and are negative. ? ?Problems:  ?Patient Active Problem List  ? Diagnosis Date Noted  ? RUQ pain 01/02/2022  ? Depression 02/02/2019  ?  ?Allergies:  ?Allergies  ?Allergen Reactions  ? Zoloft [Sertraline]   ?  GI Issues  ? ?Medications:  ?Current Outpatient Medications:  ?  buPROPion (WELLBUTRIN XL) 300 MG 24 hr tablet, Take 1 tablet (300 mg total) by mouth daily. TAKE 1 TABLET(150 MG) BY MOUTH DAILY, Disp: 90 tablet, Rfl: 1 ?  drospirenone-ethinyl estradiol (YASMIN) 3-0.03 MG tablet, TAKE 1 TABLET BY  MOUTH DAILY, Disp: 84 tablet, Rfl: 0 ?  escitalopram (LEXAPRO) 5 MG tablet, Take 1 tablet (5 mg total) by mouth daily., Disp: 90 tablet, Rfl: 1 ?  ketorolac (TORADOL) 10 MG tablet, Take 1 tablet (10 mg total) by mouth every 6 (six) hours as needed., Disp: 20 tablet, Rfl: 0 ? ?Observations/Objective: ?Patient is well-developed, well-nourished in no acute distress.  ?Resting comfortably  at home.  ?Head is normocephalic, atraumatic.  ?No labored breathing.  ?Speech is clear and coherent with logical content.  ?Patient is alert and oriented at baseline.  ? ? ?Assessment and Plan: ? ?Willeen Cass Mounce in today with chief complaint of possible ulcer ? ?1. Duodenal ulcer ?First 24 Hours-Clear liquids ? popsicles ? Jello ? gatorade ? Sprite ?Second 24 hours-Add Full liquids ( Liquids you cant see through) ?Third 24 hours- Bland diet ( foods that are baked or broiled) ? *avoiding fried foods and highly spiced foods* ?During these 3 days ? Avoid milk, cheese, ice cream or any other dairy products ? Avoid caffeine- REMEMBER Mt. Dew and [place] Yellow contain lots of caffeine ?You should eat and drink in  Frequent small volumes ?If no improvement in symptoms or worsen in 2-3 days should RETRUN TO OFFICE or go to ER! ?  ?Meds ordered this encounter  ?Medications  ? lansoprazole (PREVACID) 30 MG capsule  ?  Sig: Take 1 capsule (30 mg total) by mouth daily at 12 noon.  ?  Dispense:  30 capsule  ?  Refill:  1  ?  Order Specific Question:   Supervising Provider  ?  Answer:   Noemi Chapel [[id number]]  ? ? ? ? ? ?Follow Up Instructions: ?I discussed the assessment and treatment plan with the patient. The patient was provided an opportunity to ask questions and all were answered. The patient agreed with the plan and demonstrated an understanding of the instructions.  A copy of instructions were sent to the patient via MyChart. ? ?The patient was advised to call back or seek an in-person evaluation if the symptoms worsen or if the condition fails to improve as anticipated. ? ?Time:  ?I spent 8 minutes with the patient via telehealth technology discussing the above problems/concerns.   ? ?Mary-Margaret Hassell Done, FNP ? ?

## 2022-01-06 NOTE — Patient Instructions (Addendum)
Peptic Ulcer Eating Plan A peptic ulcer is a sore in the lining of the stomach (gastric ulcer) or the first part of the small intestine (duodenal ulcer). These sores are also called stomach ulcers. When ulcers develop, they can cause a burning feeling in the stomach as well as bloating, nausea, vomiting, and poor appetite. If you have a history of peptic ulcers, it is important to keep track of what foods and drinks cause symptoms. What are tips for following this plan?  Eat a healthy, well-balanced diet. This includes: Fresh fruits and vegetables. Eat a variety of colors of fruits and vegetables. Whole grains. Try to make sure at least half of the grains you eat each day are whole grains. Low-fat dairy. Lean meat, fish, poultry, eggs, beans, and nuts. Healthy fats, such as olive oil, grapeseed oil, or canola oil. Try to eat less than 8 teaspoons of fats and oils each day. Avoid foods that cause irritation or pain. These may be different for different people. Keep a food diary to identify foods that cause symptoms. Avoid processed foods that have added salt and sugar. Avoid drinking alcohol. Avoid drinks with caffeine, such as cola, black tea, energy drinks, and coffee. Recommended foods Grains Whole grains. Vegetables All fresh or frozen vegetables. Low-sodium canned vegetables. Fruits All fresh, frozen, or dried fruit. Fruit canned in juice. Meats and other protein foods Lean cuts of meat. Skinless poultry. Fresh or canned fish. Eggs. Tofu. Nuts and nut butter. Dried beans. Low-sodium canned beans. Dairy Low-fat or nonfat (skim) milk. Nonfat or low-fat yogurt. Nonfat or low-fat cheese. Beverages Water. Soy or nut milks. Caffeine-free soft drinks. Herbal tea. Fats and oils Olive oil. Canola oil. Grapeseed oil. Sunflower oil. Seasoning and other foods Low-fat salad dressing. Ketchup. Low-fat mayonnaise. All spices except pepper. Low-sodium seasoning mixes. The items listed above may  not be a complete list of foods and beverages you can eat. Contact a dietitian for more information. Foods to avoid Meats and other protein foods Fatty meats. Fried meats. Any meat that causes symptoms. Dairy Whole milk. Ice cream. Cream. Chocolate milk. Beverages Alcohol. Coffee. Cola and energy drinks. Black or green tea. Cocoa. Fats and oils Butter. Lard. Ghee. Seasoning and other foods Pepper. Hot sauce. Any seasonings or condiments that cause symptoms. The items listed above may not be a complete list of foods and beverages that you should avoid. Contact a dietitian for more information. Summary Peptic ulcers can cause burning in the stomach as well as bloating, nausea, vomiting, and poor appetite. You may be able to limit symptoms by avoiding foods that make you feel worse. Work with your dietitian or health care provider to identify foods that cause symptoms. This may include caffeinated drinks, alcohol, or pepper. This information is not intended to replace advice given to you by your health care provider. Make sure you discuss any questions you have with your health care provider. Document Revised: 03/31/2021 Document Reviewed: 03/31/2021 Elsevier Patient Education  2023 Elsevier Inc.  

## 2022-01-08 NOTE — Progress Notes (Signed)
Please relay message below and check on patient, as she missed visit Friday and want to make sure she is okay: ?Good afternoon Tammy Harper, your gall bladder labs -- GGT and Alkaline Phosphatase + liver function (AST and ALT) are all normal. ?CBC shows no signs of infection. ?Pancreas labs, amylase and lipase, are normal. ?Plus they reported your imaging returned normal. ?Did it hurt today when they did imaging and pushed on belly? ?I had high suspicion for gall bladder based on exam, but all of this is normal. ?If pain continues next step would be CT scan of belly. ?How are you feeling? How is pain today? ?Is Toradol helping? ?My other thought is sending in a low dose muscle relaxer as I wonder if this is more muscle pain. ?Keep being amazing!! ?Thank you for allowing me to participate in your care. ?I appreciate you. ?Kindest regards, ?Gertha Lichtenberg

## 2022-01-28 ENCOUNTER — Other Ambulatory Visit: Payer: Self-pay | Admitting: Nurse Practitioner

## 2022-01-29 ENCOUNTER — Other Ambulatory Visit: Payer: Self-pay | Admitting: Nurse Practitioner

## 2022-01-30 ENCOUNTER — Encounter: Payer: Self-pay | Admitting: Nurse Practitioner

## 2022-01-30 ENCOUNTER — Telehealth: Payer: Self-pay | Admitting: Nurse Practitioner

## 2022-01-30 MED ORDER — DROSPIRENONE-ETHINYL ESTRADIOL 3-0.03 MG PO TABS: 1.0000 | ORAL_TABLET | Freq: Every day | ORAL | 1 refills | Status: DC

## 2022-01-30 NOTE — Telephone Encounter (Signed)
Medication refilled 01/30/2022#84 1 refill. Requested Prescriptions  Pending Prescriptions Disp Refills  . drospirenone-ethinyl estradiol (YASMIN) 3-0.03 MG tablet [Pharmacy Med Name: DROSPIRENONE-ETHINYL 3-0.'03MG'$  T 28S] 84 tablet 0    Sig: TAKE 1 TABLET BY MOUTH DAILY     OB/GYN:  Contraceptives Failed - 01/28/2022  8:40 PM      Failed - Patient is not a smoker      Passed - Last BP in normal range    BP Readings from Last 1 Encounters:  01/02/22 131/76         Passed - Valid encounter within last 12 months    Recent Outpatient Visits          4 weeks ago RUQ pain   [facility] [place], [place] T, NP   3 months ago Rash of face   [place], NP   4 months ago Cold sore   [facility] [place], Santiago Glad, NP   5 months ago Mild episode of recurrent major depressive disorder [facility])   [facility] Jon Billings, NP   6 months ago Mild episode of recurrent major depressive disorder [facility])   [facility] Jon Billings, NP

## 2022-01-30 NOTE — Telephone Encounter (Signed)
Copied from [place] [phone]. Topic: General - Other >> Jan 30, 2022  8:02 AM Loma [place] wrote:  drospirenone-ethinyl estradiol Edmonia Caprio) 3-0.03 Pt wanted FYI put in her chart that she has request a refill on MyChart and is completely out.

## 2022-01-30 NOTE — Telephone Encounter (Signed)
Refill request from the patient has already been sent to the provider.

## 2022-03-31 ENCOUNTER — Other Ambulatory Visit: Payer: Self-pay | Admitting: Nurse Practitioner

## 2022-04-02 ENCOUNTER — Other Ambulatory Visit: Payer: Self-pay | Admitting: Nurse Practitioner

## 2022-04-02 NOTE — Telephone Encounter (Signed)
Requested Prescriptions  Pending Prescriptions Disp Refills  . buPROPion (WELLBUTRIN XL) 300 MG 24 hr tablet [Pharmacy Med Name: BUPROPION XL '300MG'$  TABLETS] 90 tablet 0    Sig: TAKE 1 TABLET BY MOUTH DAILY     Psychiatry: Antidepressants - bupropion Passed - 03/31/2022  5:35 PM      Passed - Cr in normal range and within 360 days    Creatinine  Date Value Ref Range Status  12/22/2011 0.61 0.60 - 1.30 mg/dL Final   Creatinine, Ser  Date Value Ref Range Status  01/02/2022 0.72 0.57 - 1.00 mg/dL Final         Passed - AST in normal range and within 360 days    AST  Date Value Ref Range Status  01/02/2022 18 0 - 40 IU/L Final         Passed - ALT in normal range and within 360 days    ALT  Date Value Ref Range Status  01/02/2022 18 0 - 32 IU/L Final         Passed - Completed PHQ-2 or PHQ-9 in the last 360 days      Passed - Last BP in normal range    BP Readings from Last 1 Encounters:  01/02/22 131/76         Passed - Valid encounter within last 6 months    Recent Outpatient Visits          3 months ago RUQ pain   [place], Henrine Screws T, NP   5 months ago Rash of face   [place], NP   6 months ago Cold sore   [facility] [place], Santiago Glad, NP   7 months ago Mild episode of recurrent major depressive disorder [facility])   [facility] Jon Billings, NP   8 months ago Mild episode of recurrent major depressive disorder [facility])   [facility] Jon Billings, NP

## 2022-04-03 NOTE — Telephone Encounter (Signed)
Requested Prescriptions  Pending Prescriptions Disp Refills  . escitalopram (LEXAPRO) 5 MG tablet [Pharmacy Med Name: ESCITALOPRAM '5MG'$  TABLETS] 90 tablet 0    Sig: TAKE 1 TABLET(5 MG) BY MOUTH DAILY     Psychiatry:  Antidepressants - SSRI Passed - 04/02/2022  2:45 PM      Passed - Completed PHQ-2 or PHQ-9 in the last 360 days      Passed - Valid encounter within last 6 months    Recent Outpatient Visits          3 months ago RUQ pain   [place], Pindall T, NP   5 months ago Rash of face   [place], NP   6 months ago Cold sore   [facility] [place], Santiago Glad, NP   7 months ago Mild episode of recurrent major depressive disorder [facility])   [facility] Jon Billings, NP   8 months ago Mild episode of recurrent major depressive disorder [facility])   [facility] Jon Billings, NP

## 2022-04-14 IMAGING — US US PELVIS COMPLETE TRANSABD/TRANSVAG W DUPLEX
1 series · 13 of 25 positions shown · non-contrast
Comparison: CT abdomen and pelvis 10/03/2020

CLINICAL DATA: Mid pelvic pain for 1 day, LMP 09/12/2020, negative
urinary pregnancy test

EXAM:
TRANSABDOMINAL AND TRANSVAGINAL ULTRASOUND OF PELVIS
DOPPLER ULTRASOUND OF OVARIES
TECHNIQUE: Both transabdominal and transvaginal ultrasound examinations of the
pelvis were performed. Transabdominal technique was performed for
global imaging of the pelvis including uterus, ovaries, adnexal
regions, and pelvic cul-de-sac.
It was necessary to proceed with endovaginal exam following the
transabdominal exam to visualize the uterus, endometrium, and
ovaries. Color and duplex Doppler ultrasound was utilized to
evaluate blood flow to the ovaries.

[Series 1: us pelvic complete w transvaginal and torsion righ · 13 of 99 slices shown]
[im 1/99]
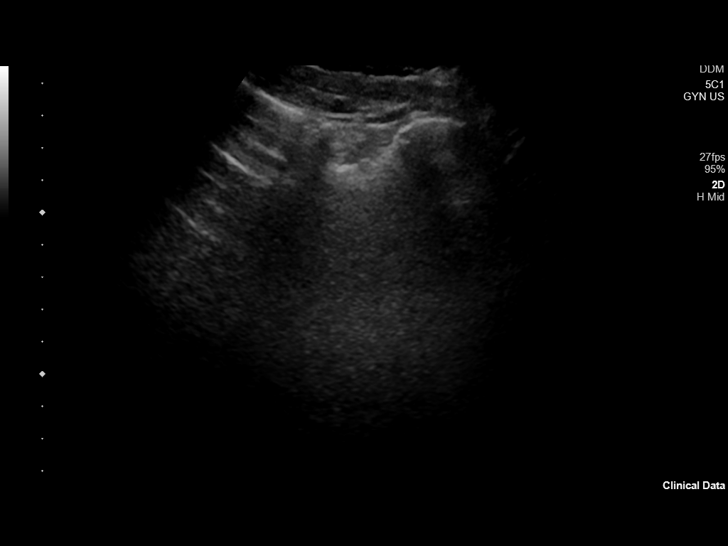
[im 9/99]
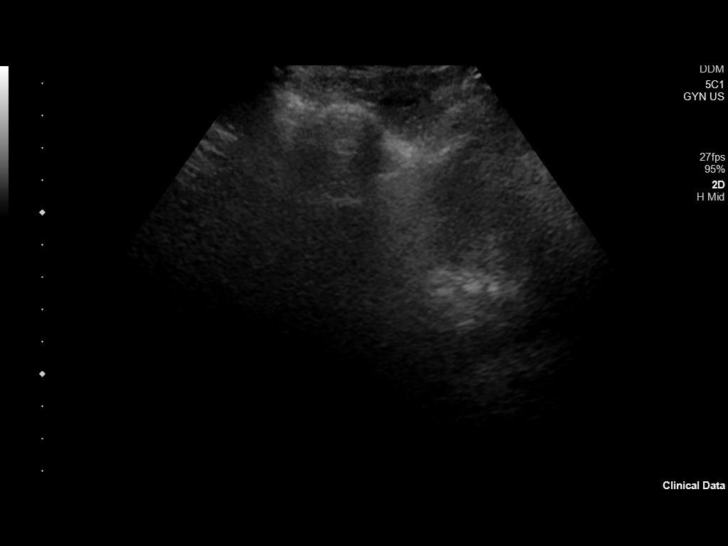
[im 17/99]
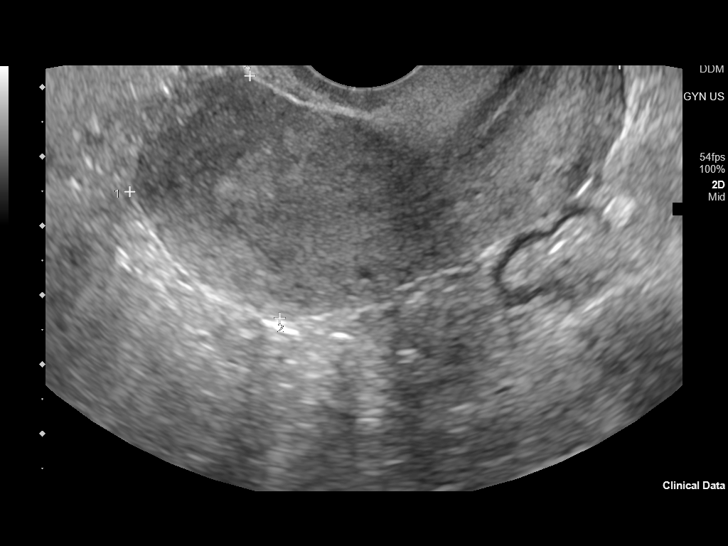
[im 25/99]
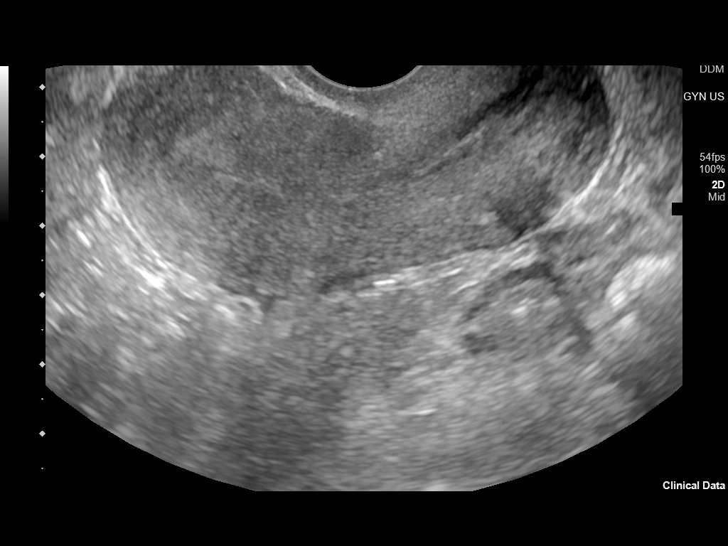
[im 33/99]
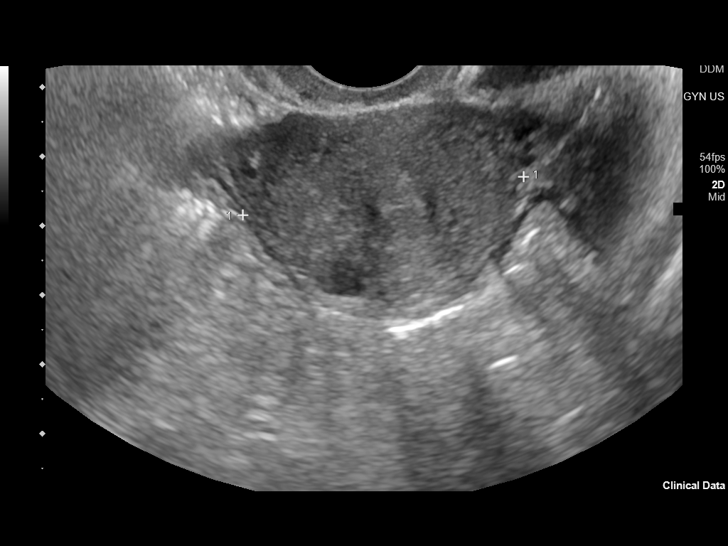
[im 41/99]
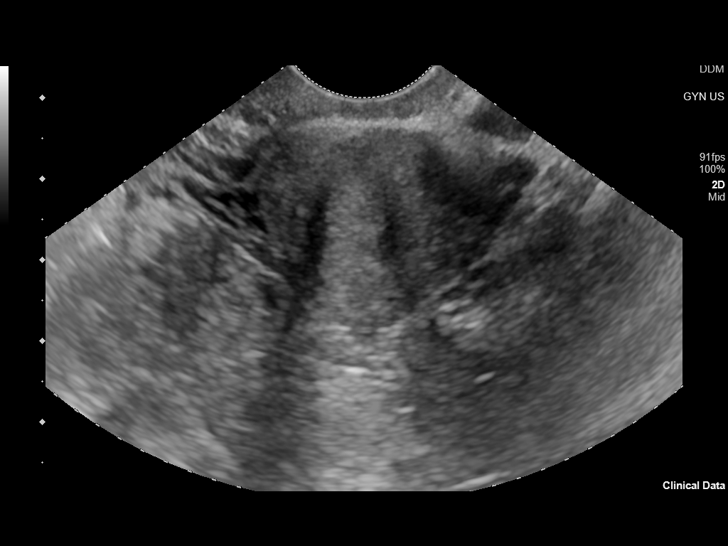
[im 50/99]
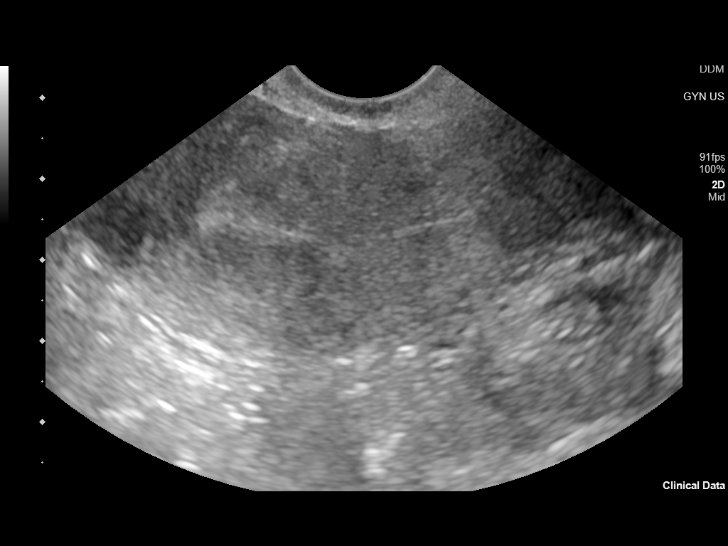
[im 58/99]
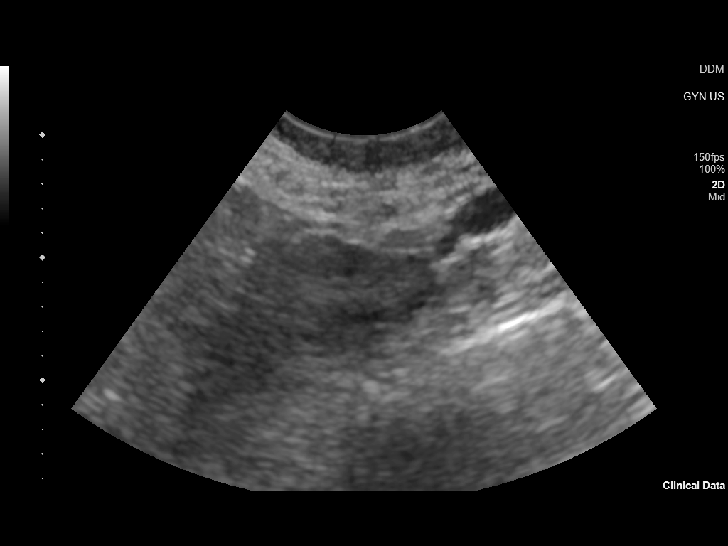
[im 66/99]
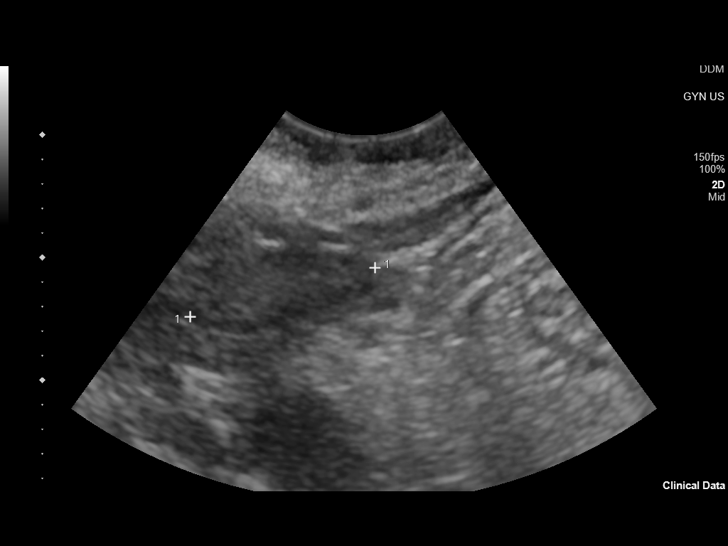
[im 74/99]
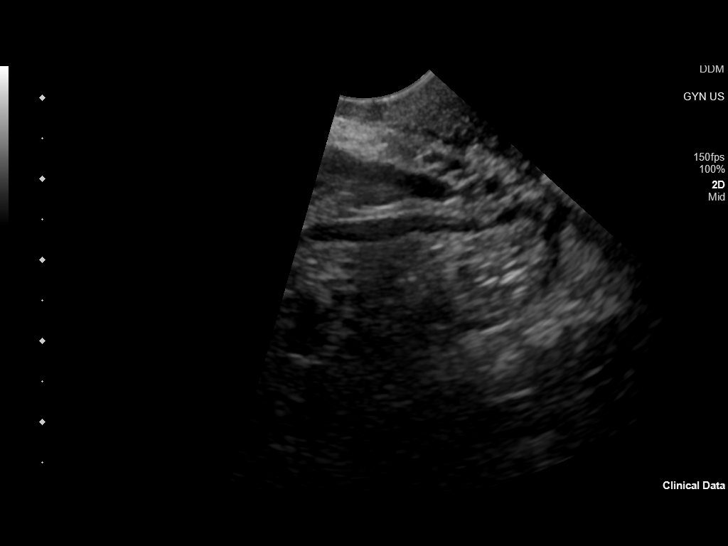
[im 82/99]
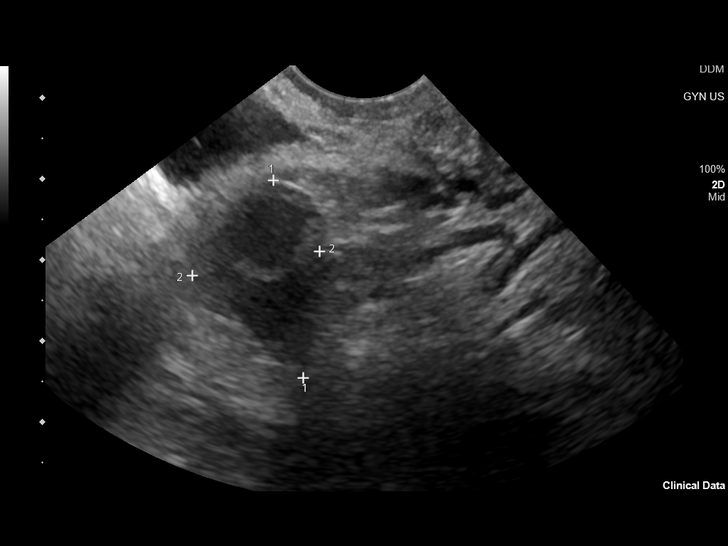
[im 90/99]
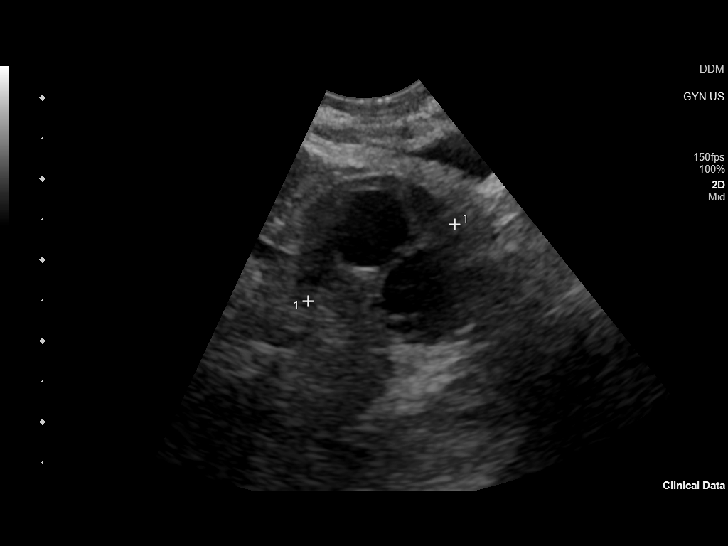
[im 99/99]
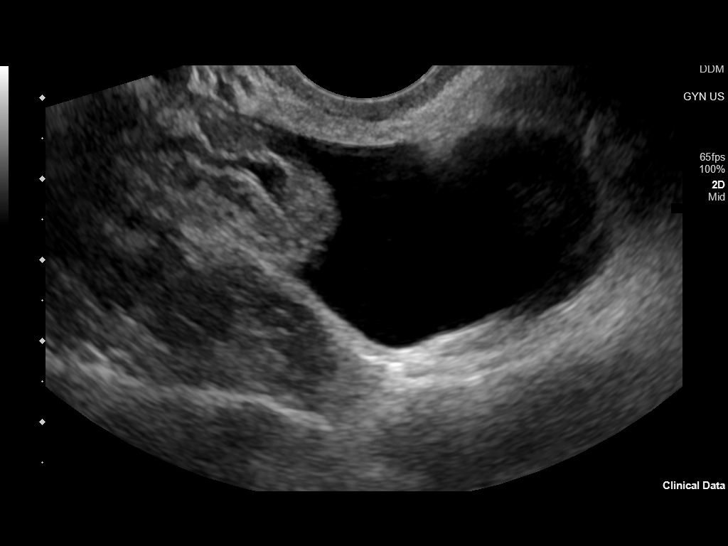

[13 of 25 positions shown; findings below may reference images not displayed]

FINDINGS: Uterus

Measurements: 7.3 x 3.5 x 4.1 cm = volume: 55 mL. Anteverted. Normal
morphology. Small subserosal leiomyoma at posterior upper uterus 9
mm diameter. Additional small intramural leiomyoma at RIGHT uterus 9
mm diameter.

Endometrium

Thickness: 5 mm.  No endometrial fluid or focal abnormality

Right ovary

Measurements: 1.5 x 0.9 x 1.6 cm = volume: 1.0 mL. Normal morphology
without mass. Blood flow present within RIGHT ovary on color Doppler
imaging.

Left ovary

Measurements: 2.5 x 1.6 x 2.0 cm = volume: 4.2 mL. Normal morphology
without mass. Blood flow present within LEFT ovary on color Doppler
imaging.

Pulsed Doppler evaluation of both ovaries demonstrates normal
low-resistance arterial and venous waveforms.

Other findings

Small to moderate amount of free fluid pelvic fluid. No adnexal
masses.
IMPRESSION: Two small uterine leiomyomata.

Normal appearing endometrial complex and ovaries.

Small to moderate amount of nonspecific free pelvic fluid.

No evidence of ovarian mass or torsion.

## 2022-04-14 IMAGING — CT CT ABD-PELV W/ CM
2 of 4 series · 16 of 46 positions shown, 18 images · IV contrast (APPLIED)
Comparison: None.

CLINICAL DATA: Right lower quadrant abdominal pain

EXAM:
CT ABDOMEN AND PELVIS WITH CONTRAST
TECHNIQUE: Multidetector CT imaging of the abdomen and pelvis was performed
using the standard protocol following bolus administration of
intravenous contrast.
CONTRAST:  100mL OMNIPAQUE IOHEXOL 300 MG/ML  SOLN

[Series 2: axial st · axial · 0.76mm/px · z∈[-784,-380]mm · 13 of 89 slices shown, 15 images]
[im 4/89  soft-tissue]
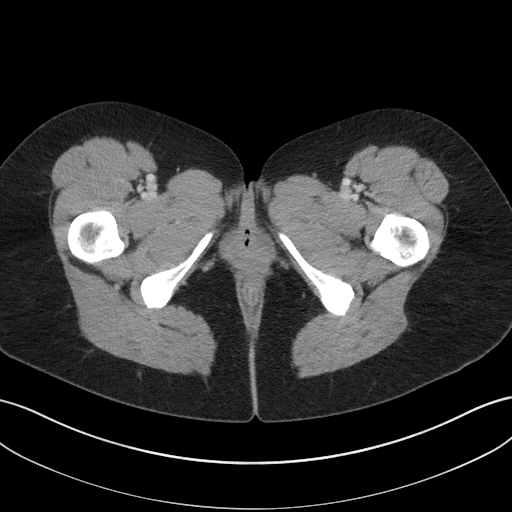
[im 4/89  bone]
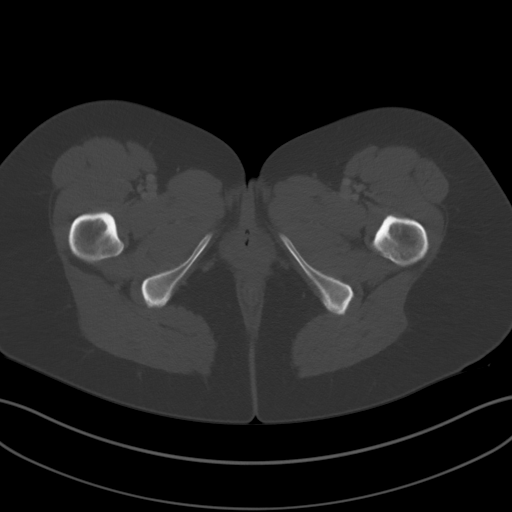
[im 12/89  soft-tissue]
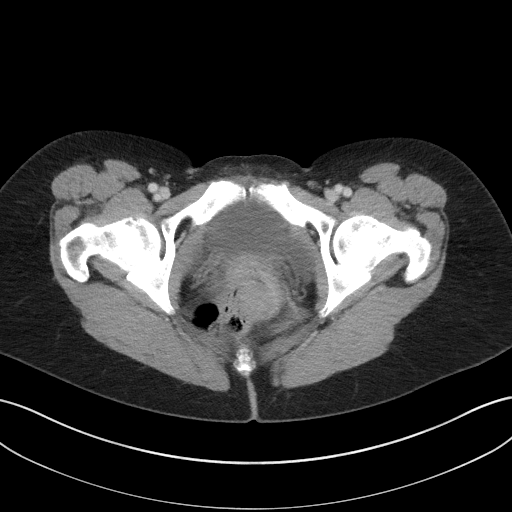
[im 19/89  soft-tissue]
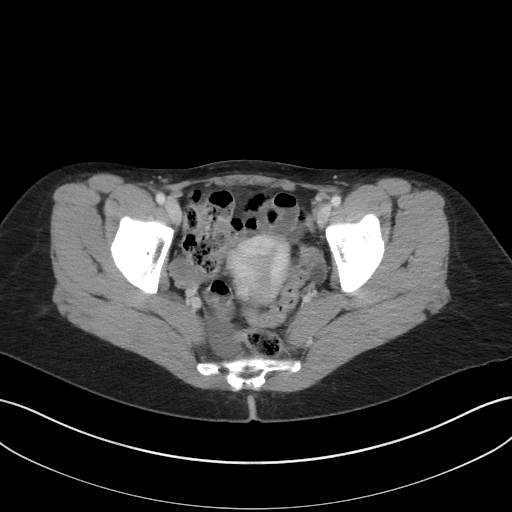
[im 26/89  soft-tissue]
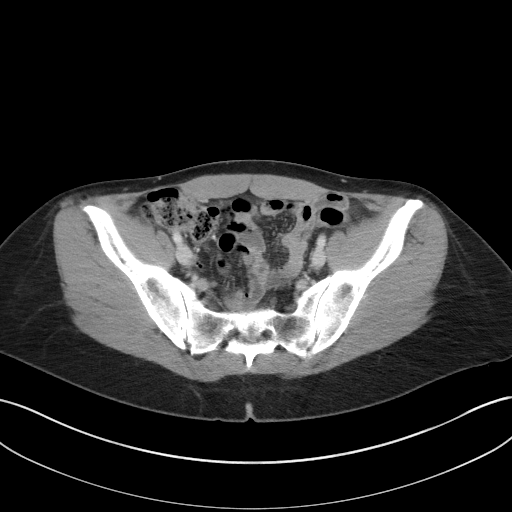
[im 30/89  soft-tissue]
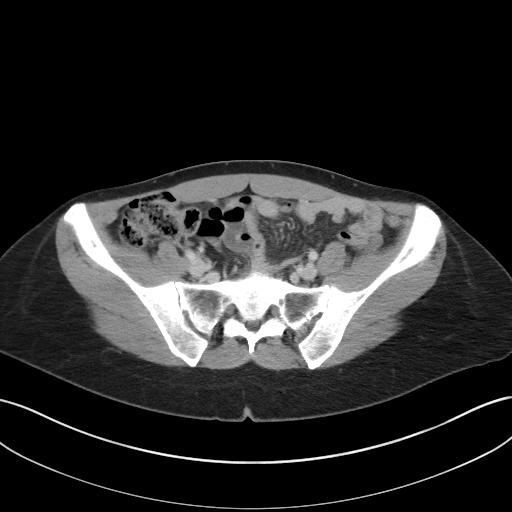
[im 37/89  soft-tissue]
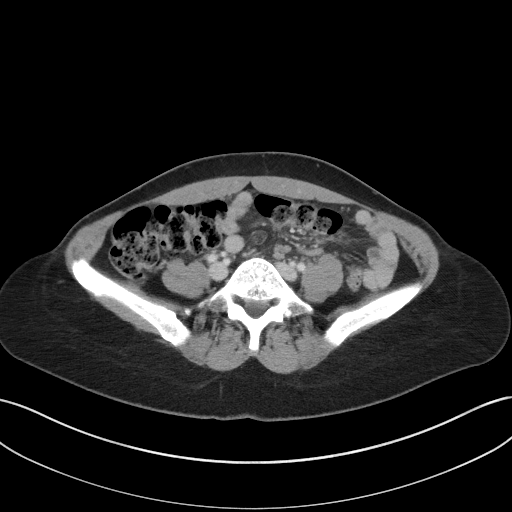
[im 45/89  soft-tissue]
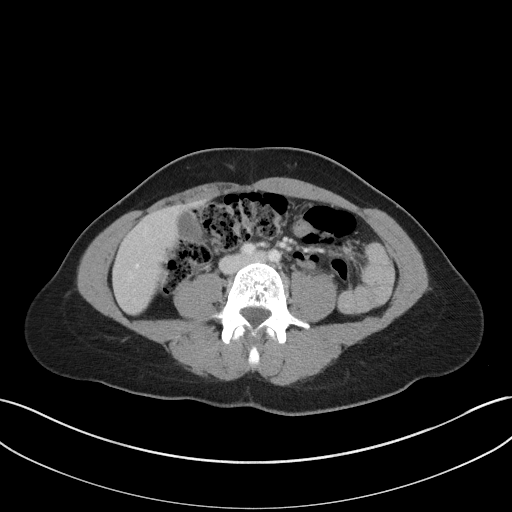
[im 52/89  soft-tissue]
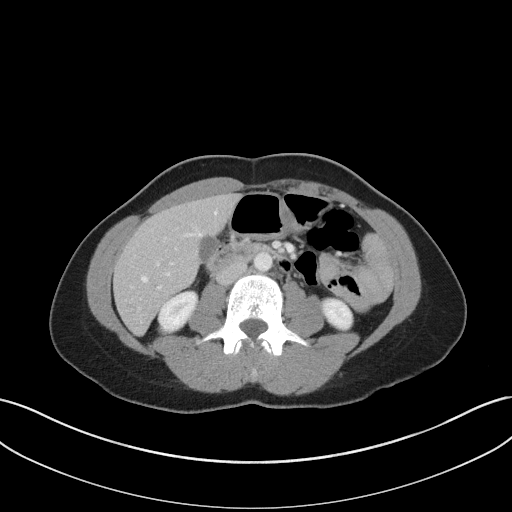
[im 59/89  soft-tissue]
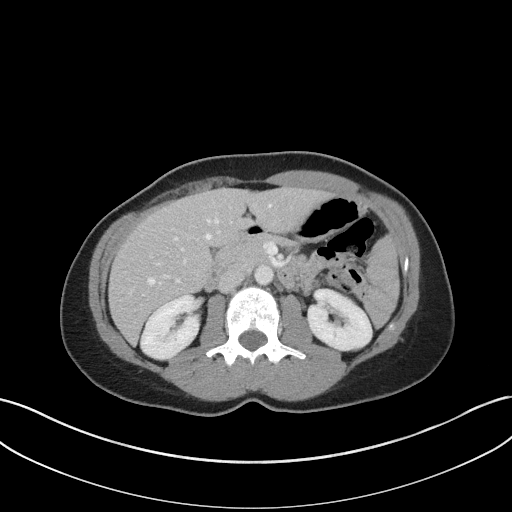
[im 59/89  bone]
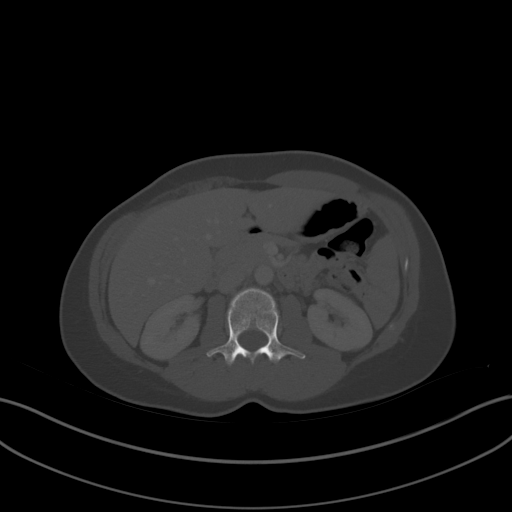
[im 63/89  soft-tissue]
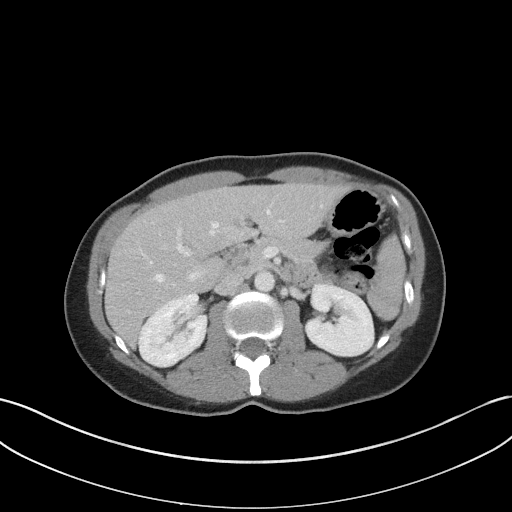
[im 70/89  soft-tissue]
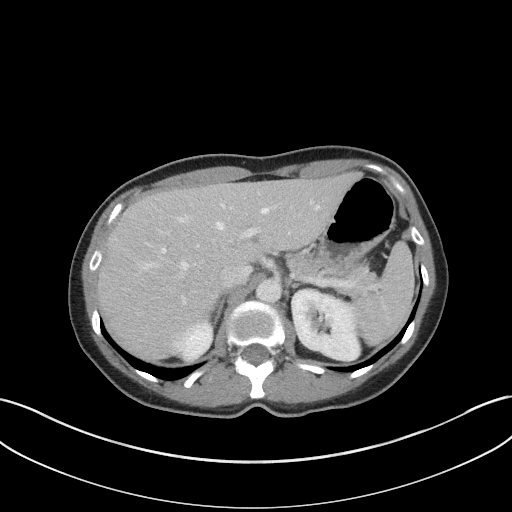
[im 78/89  soft-tissue]
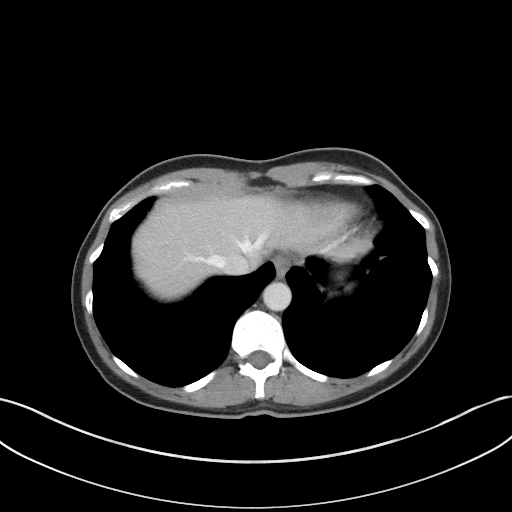
[im 85/89  soft-tissue]
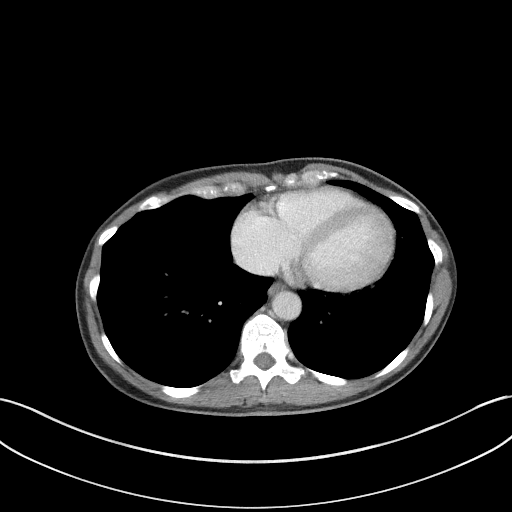

[Series 6: coronal st · coronal · 0.75mm/px · 3 of 72 slices shown]
[im 24/72  soft-tissue]
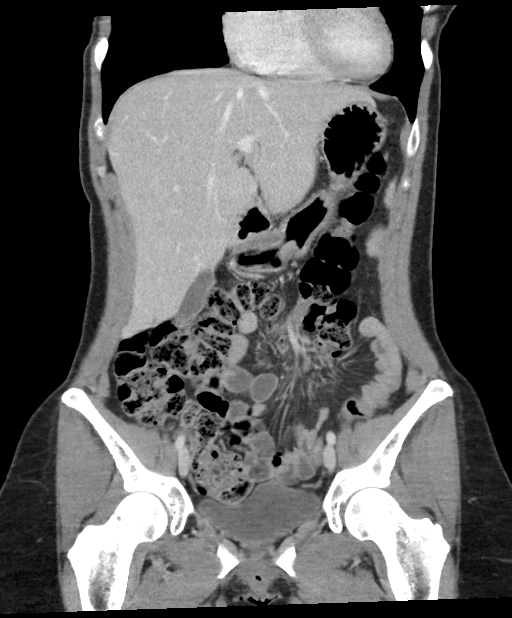
[im 32/72  soft-tissue]
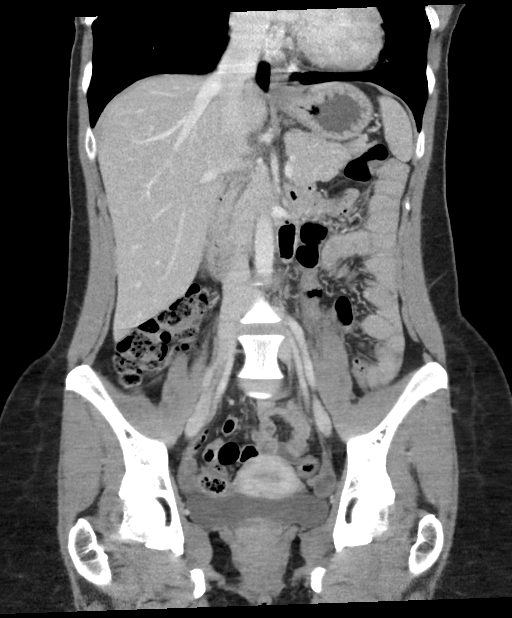
[im 40/72  soft-tissue]
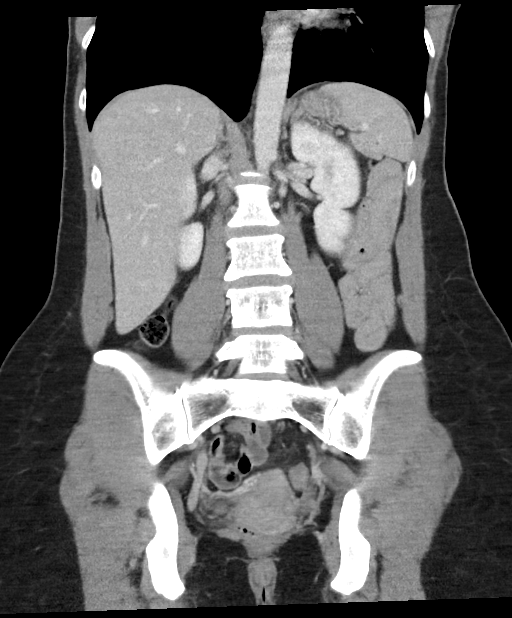

[16 of 46 positions shown; findings below may reference images not displayed]

FINDINGS: Lower chest: 7 mm nodule noted within the visualized right lung
base, axial image # [DATE], indeterminate. The lung bases are otherwise
clear. The visualized heart and pericardium are unremarkable.

Hepatobiliary: Tiny right hepatic cysts. Liver otherwise
unremarkable. No intra or extrahepatic biliary ductal dilation.
Gallbladder unremarkable.

Pancreas: Unremarkable

Spleen: Unremarkable

Adrenals/Urinary Tract: Adrenal glands are unremarkable. Kidneys are
normal, without renal calculi, focal lesion, or hydronephrosis.
Bladder is unremarkable.

Stomach/Bowel: Stomach is within normal limits. Appendix appears
normal. No evidence of bowel wall thickening, distention, or
inflammatory changes. No free intraperitoneal gas. Small free fluid
within the pelvis is nonspecific, possibly physiologic in a patient
of this age.

Vascular/Lymphatic: No significant vascular findings are present. No
enlarged abdominal or pelvic lymph nodes.

Reproductive: Uterus and bilateral adnexa are unremarkable.

Other: No abdominal wall hernia

Musculoskeletal: Bone island within the right ilium. No suspicious
lytic or blastic bone lesion identified. No acute bone abnormality.
IMPRESSION: Mild free fluid within the pelvis, possibly physiologic. No acute
intra-abdominal pathology identified. No radiographic explanation
for the patient's reported symptoms.

7 mm indeterminate pulmonary nodule within the right lung base.
Dedicated CT imaging of the chest is recommended for further
characterization.

## 2022-07-09 ENCOUNTER — Other Ambulatory Visit: Payer: Self-pay

## 2022-07-09 ENCOUNTER — Encounter: Payer: Self-pay | Admitting: Nurse Practitioner

## 2022-07-09 MED ORDER — DROSPIRENONE-ETHINYL ESTRADIOL 3-0.03 MG PO TABS: 1.0000 | ORAL_TABLET | Freq: Every day | ORAL | 0 refills | Status: DC

## 2022-07-09 MED ORDER — BUPROPION HCL ER (XL) 300 MG PO TB24: 300.0000 mg | ORAL_TABLET | Freq: Every day | ORAL | 0 refills | Status: DC

## 2022-07-09 NOTE — Telephone Encounter (Signed)
Medication refill request for Bupropion '300MG'$ . Last filled 05/17/2022. Last seen in office 6 months ago, no follow up scheduled. Please advise.

## 2022-07-09 NOTE — Telephone Encounter (Signed)
Patient requesting medication refill for Yasmin birth control. Filled last on 01/30/2022 #84 with 1 RF. Please advise.

## 2022-07-23 ENCOUNTER — Encounter: Payer: Self-pay | Admitting: Nurse Practitioner

## 2022-07-23 ENCOUNTER — Telehealth (INDEPENDENT_AMBULATORY_CARE_PROVIDER_SITE_OTHER): Payer: BC Managed Care – PPO | Admitting: Nurse Practitioner

## 2022-07-23 ENCOUNTER — Other Ambulatory Visit: Payer: Self-pay | Admitting: Nurse Practitioner

## 2022-07-23 DIAGNOSIS — Z1231 Encounter for screening mammogram for malignant neoplasm of breast: Secondary | ICD-10-CM

## 2022-07-23 DIAGNOSIS — F330 Major depressive disorder, recurrent, mild: Secondary | ICD-10-CM | POA: Diagnosis not present

## 2022-07-23 MED ORDER — BUPROPION HCL ER (XL) 300 MG PO TB24: 300.0000 mg | ORAL_TABLET | Freq: Every day | ORAL | 0 refills | Status: DC

## 2022-07-23 MED ORDER — DROSPIRENONE-ETHINYL ESTRADIOL 3-0.03 MG PO TABS: 1.0000 | ORAL_TABLET | Freq: Every day | ORAL | 0 refills | Status: DC

## 2022-07-23 MED ORDER — TOPIRAMATE 50 MG PO TABS: 50.0000 mg | ORAL_TABLET | Freq: Two times a day (BID) | ORAL | 0 refills | Status: DC

## 2022-07-23 MED ORDER — ESCITALOPRAM OXALATE 10 MG PO TABS: 10.0000 mg | ORAL_TABLET | Freq: Every day | ORAL | 0 refills | Status: DC

## 2022-07-23 NOTE — Assessment & Plan Note (Signed)
Chronic.  Controlled.  Continue with current medication regimen of Wellbutrin '300mg'$  and Lexapro '10mg'$ .  Refills sent today.  Return to clinic in 1 months for reevaluation.  Call sooner if concerns arise.

## 2022-07-23 NOTE — Progress Notes (Signed)
LMP 07/09/2022 (Approximate)    Subjective:    Patient ID: Tammy Harper, female    DOB: 07-10-1981, 41 y.o.   MRN: [id number]  HPI: Tammy Harper is a 41 y.o. female  Chief Complaint  Patient presents with   Medication Refill    DEPRESSION/ANXIETY Patient states she feels like things aren't going that great. Patient states is taking Lexapro 42m and Wellbutrin 3077m    She feels like this combination is working well for her.  Denies concerns at visit today.         01/02/2022    3:56 PM 08/30/2021    4:00 PM 07/31/2021    3:49 PM 03/23/2020    8:02 AM  GAD 7 : Generalized Anxiety Score  Nervous, Anxious, on Edge _0 0  Control/stop worrying _1 0  Worry too much - different things _2 0  Trouble relaxing 0 1 1 0  Restless 0 0 0 0  Easily annoyed or irritable 0 2 1 0  Afraid - awful might happen 0 1 0 0  Total GAD 7 Score _3 0  Anxiety Difficulty Not difficult at all Somewhat difficult Not difficult at all     [facility]20[place] Total Score 0          Relevant past medical, surgical, family and social history reviewed and updated as indicated. Interim medical history since our last visit reviewed. Allergies and medications reviewed and updated.  Review of Systems  Psychiatric/Behavioral:  Positive for dysphoric mood. Negative for suicidal ideas. The patient is nervous/anxious.     Per HPI unless specifically indicated above     Objective:    LMP 07/09/2022 (Approximate)   Wt Readings from Last 3 Encounters:  01/02/22 163 lb 6.4 oz (74.1 kg)  05/03/21 150 lb (68 kg)  10/03/20 147 lb 6.4 oz (66.9 kg)    Physical Exam Vitals and nursing note reviewed.  HENT:     Head: Normocephalic.     Right Ear: Hearing normal.     Left Ear: Hearing normal.     Nose: Nose normal.  Eyes:     Pupils: Pupils are equal, round, and reactive to light.  Pulmonary:     Effort: Pulmonary effort  is normal. No respiratory distress.  Neurological:     Mental Status: She is alert.  Psychiatric:        Mood and Affect: Mood normal.        Behavior: Behavior normal.        Thought Content: Thought content normal.        Judgment: Judgment normal.     Results for orders placed or performed in visit on 01/02/22  Microscopic Examination  Result Value Ref Range   WBC, UA None seen 0 - 5 /hpf   RBC, Urine 0-2 0 - 2 /hpf   Epithelial Cells (non renal) 0-10 0 - 10 /hpf   Mucus, UA Present (A) Not Estab.   Bacteria, UA Moderate (A) None seen/Few  WET PREP FOR TRICH, YEAST, CLUE   Urine  Result Value Ref Range   Trichomonas Exam Negative Negative   Yeast Exam Negative Negative   Clue Cell Exam Negative Negative  Urine Culture   Specimen: Urine   UR  Result Value Ref Range   Urine Culture, Routine Final report    Organism ID, Bacteria Comment   Urinalysis, Routine w  reflex microscopic  Result Value Ref Range   Specific Gravity, UA >1.030 (H) 1.005 - 1.030   pH, UA 5.5 5.0 - 7.5   Color, UA Yellow Yellow   Appearance Ur Clear Clear   Leukocytes,UA Negative Negative   Protein,UA Negative Negative/Trace   Glucose, UA Negative Negative   Ketones, UA Trace (A) Negative   RBC, UA Trace (A) Negative   Bilirubin, UA Negative Negative   Urobilinogen, Ur 0.2 0.2 - 1.0 mg/dL   Nitrite, UA Negative Negative   Microscopic Examination See below:   CBC with Differential/Platelet  Result Value Ref Range   WBC 10.2 3.4 - 10.8 x10E3/uL   RBC 4.00 3.77 - 5.28 x10E6/uL   Hemoglobin 12.4 11.1 - 15.9 g/dL   Hematocrit 37.0 34.0 - 46.6 %   MCV 93 79 - 97 fL   MCH 31.0 26.6 - 33.0 pg   MCHC 33.5 31.5 - 35.7 g/dL   RDW 11.8 11.7 - 15.4 %   Platelets 318 150 - 450 x10E3/uL   Neutrophils 59 Not Estab. %   Lymphs 35 Not Estab. %   Monocytes 5 Not Estab. %   Eos 1 Not Estab. %   Basos 0 Not Estab. %   Neutrophils Absolute 6.0 1.4 - 7.0 x10E3/uL   Lymphocytes Absolute 3.5 (H) 0.7 - 3.1  x10E3/uL   Monocytes Absolute 0.5 0.1 - 0.9 x10E3/uL   EOS (ABSOLUTE) 0.1 0.0 - 0.4 x10E3/uL   Basophils Absolute 0.0 0.0 - 0.2 x10E3/uL   Immature Granulocytes 0 Not Estab. %   Immature Grans (Abs) 0.0 0.0 - 0.1 x10E3/uL  Comprehensive metabolic panel  Result Value Ref Range   Glucose 84 70 - 99 mg/dL   BUN 11 6 - 24 mg/dL   Creatinine, Ser 0.72 0.57 - 1.00 mg/dL   eGFR 108 >59 mL/min/1.73   BUN/Creatinine Ratio 15 9 - 23   Sodium 138 134 - 144 mmol/L   Potassium 3.7 3.5 - 5.2 mmol/L   Chloride 102 96 - 106 mmol/L   CO2 25 20 - 29 mmol/L   Calcium 9.3 8.7 - 10.2 mg/dL   Total Protein 6.5 6.0 - 8.5 g/dL   Albumin 4.4 3.8 - 4.8 g/dL   Globulin, Total 2.1 1.5 - 4.5 g/dL   Albumin/Globulin Ratio 2.1 1.2 - 2.2   Bilirubin Total <0.2 0.0 - 1.2 mg/dL   Alkaline Phosphatase 59 44 - 121 IU/L   AST 18 0 - 40 IU/L   ALT 18 0 - 32 IU/L  Gamma GT  Result Value Ref Range   GGT 14 0 - 60 IU/L  Lipase  Result Value Ref Range   Lipase 30 14 - 72 U/L  Amylase  Result Value Ref Range   Amylase 38 31 - 110 U/L      Assessment & Plan:   Problem List Items Addressed This Visit       Other   Depression    Chronic.  Controlled.  Continue with current medication regimen of Wellbutrin 336m and Lexapro 176m  Refills sent today.  Return to clinic in 1 months for reevaluation.  Call sooner if concerns arise.        Relevant Medications   escitalopram (LEXAPRO) 10 MG tablet   buPROPion (WELLBUTRIN XL) 300 MG 24 hr tablet   Other Visit Diagnoses     Encounter for screening mammogram for malignant neoplasm of breast    -  Primary   Relevant Orders   MM 3D SCREEN  BREAST BILATERAL        Follow up plan: No follow-ups on file.  This visit was completed via MyChart due to the restrictions of the COVID-19 pandemic. All issues as above were discussed and addressed. Physical exam was done as above through visual confirmation on MyChart. If it was felt that the patient should be evaluated  in the office, they were directed there. The patient verbally consented to this visit. Location of the patient: Home Location of the provider: Office Those involved with this call:  Provider: Jon Billings, NP CMA: Valinda Hoar, [place] Desk/Registration: Lynnell Catalan This encounter was conducted via video.  I spent 20 dedicated to the care of this patient on the date of this encounter to include previsit review of symptoms, medications and plan of care, face to face time with the patient, and post visit ordering of testing.

## 2022-08-20 NOTE — Progress Notes (Unsigned)
LMP 07/09/2022 (Approximate)    Subjective:    Patient ID: Tammy Harper, female    DOB: [birth year], 41 y.o.   MRN: [id number]  HPI: Tammy Harper is a 41 y.o. female presenting on 08/21/2022 for comprehensive medical examination. Current medical complaints include:{Blank single:19197::"none","***"}  She currently lives with: Menopausal Symptoms: {Blank single:19197::"yes","no"}  MOOD   Depression Screen done today and results listed below:     01/02/2022    3:56 PM 08/30/2021    3:59 PM 07/31/2021    3:47 PM 05/03/2021    9:33 AM 09/15/2020    8:36 AM  Depression screen PHQ 2/9  Decreased Interest 0 2 0 0 0  Down, Depressed, Hopeless 0 2 0 0 0  PHQ - 2 Score 0 4 0 0 0  Altered sleeping 0 1 0 3 0  Tired, decreased energy 0 0 0 0 0  Change in appetite 0 0 0 0 0  Feeling bad or failure about yourself  0 0 0 0 0  Trouble concentrating 0 0 0 0 0  Moving slowly or fidgety/restless 0 0 0 0 0  Suicidal thoughts 0 1 0 0 0  PHQ-9 Score 0 6 0 3 0  Difficult doing work/chores Not difficult at all Somewhat difficult Not difficult at all Not difficult at all Not difficult at all    The patient {has/does not have:19849} a history of falls. I {did/did not:19850} complete a risk assessment for falls. A plan of care for falls {was/was not:19852} documented.   Past Medical History:  Past Medical History:  Diagnosis Date   Depression     Surgical History:  No past surgical history on file.  Medications:  Current Outpatient Medications on File Prior to Visit  Medication Sig   buPROPion (WELLBUTRIN XL) 300 MG 24 hr tablet Take 1 tablet (300 mg total) by mouth daily.   drospirenone-ethinyl estradiol (YASMIN) 3-0.03 MG tablet Take 1 tablet by mouth daily.   escitalopram (LEXAPRO) 10 MG tablet Take 1 tablet (10 mg total) by mouth daily.   ketorolac (TORADOL) 10 MG tablet Take 1 tablet (10 mg total) by mouth every 6 (six) hours as needed.   lansoprazole (PREVACID) 30 MG  capsule Take 1 capsule (30 mg total) by mouth daily at 12 noon.   topiramate (TOPAMAX) 50 MG tablet Take 1 tablet (50 mg total) by mouth 2 (two) times daily.   No current facility-administered medications on file prior to visit.    Allergies:  Allergies  Allergen Reactions   Zoloft [Sertraline]     GI Issues    Social History:  Social History   Socioeconomic History   Marital status: Married    Spouse name: Not on file   Number of children: Not on file   Years of education: Not on file   Highest education level: Not on file  Occupational History   Not on file  Tobacco Use   Smoking status: Some Days   Smokeless tobacco: Never  Vaping Use   Vaping Use: Never used  Substance and Sexual Activity   Alcohol use: Yes    Comment: once a week/ socially   Drug use: Never   Sexual activity: Yes  Other Topics Concern   Not on file  Social History Narrative   ** Merged History Encounter **       Social Determinants of Health   Financial Resource Strain: Not on file  Food Insecurity: Not on file  Transportation Needs: Not on  file  Physical Activity: Not on file  Stress: Not on file  Social Connections: Not on file  Intimate Partner Violence: Not on file   Social History   Tobacco Use  Smoking Status Some Days  Smokeless Tobacco Never   Social History   Substance and Sexual Activity  Alcohol Use Yes   Comment: once a week/ socially    Family History:  Family History  Problem Relation Age of Onset   Thyroid disease Father    Emphysema Maternal Grandmother    Emphysema Maternal Grandfather    Diabetes Paternal Grandfather     Past medical history, surgical history, medications, allergies, family history and social history reviewed with patient today and changes made to appropriate areas of the chart.   ROS All other ROS negative except what is listed above and in the HPI.      Objective:    LMP 07/09/2022 (Approximate)   Wt Readings from Last 3  Encounters:  01/02/22 163 lb 6.4 oz (74.1 kg)  05/03/21 150 lb (68 kg)  10/03/20 147 lb 6.4 oz (66.9 kg)    Physical Exam  Results for orders placed or performed in visit on 01/02/22  Microscopic Examination  Result Value Ref Range   WBC, UA None seen 0 - 5 /hpf   RBC, Urine 0-2 0 - 2 /hpf   Epithelial Cells (non renal) 0-10 0 - 10 /hpf   Mucus, UA Present (A) Not Estab.   Bacteria, UA Moderate (A) None seen/Few  WET PREP FOR TRICH, YEAST, CLUE   Urine  Result Value Ref Range   Trichomonas Exam Negative Negative   Yeast Exam Negative Negative   Clue Cell Exam Negative Negative  Urine Culture   Specimen: Urine   UR  Result Value Ref Range   Urine Culture, Routine Final report    Organism ID, Bacteria Comment   Urinalysis, Routine w reflex microscopic  Result Value Ref Range   Specific Gravity, UA >1.030 (H) 1.005 - 1.030   pH, UA 5.5 5.0 - 7.5   Color, UA Yellow Yellow   Appearance Ur Clear Clear   Leukocytes,UA Negative Negative   Protein,UA Negative Negative/Trace   Glucose, UA Negative Negative   Ketones, UA Trace (A) Negative   RBC, UA Trace (A) Negative   Bilirubin, UA Negative Negative   Urobilinogen, Ur 0.2 0.2 - 1.0 mg/dL   Nitrite, UA Negative Negative   Microscopic Examination See below:   CBC with Differential/Platelet  Result Value Ref Range   WBC 10.2 3.4 - 10.8 x10E3/uL   RBC 4.00 3.77 - 5.28 x10E6/uL   Hemoglobin 12.4 11.1 - 15.9 g/dL   Hematocrit 37.0 34.0 - 46.6 %   MCV 93 79 - 97 fL   MCH 31.0 26.6 - 33.0 pg   MCHC 33.5 31.5 - 35.7 g/dL   RDW 11.8 11.7 - 15.4 %   Platelets 318 150 - 450 x10E3/uL   Neutrophils 59 Not Estab. %   Lymphs 35 Not Estab. %   Monocytes 5 Not Estab. %   Eos 1 Not Estab. %   Basos 0 Not Estab. %   Neutrophils Absolute 6.0 1.4 - 7.0 x10E3/uL   Lymphocytes Absolute 3.5 (H) 0.7 - 3.1 x10E3/uL   Monocytes Absolute 0.5 0.1 - 0.9 x10E3/uL   EOS (ABSOLUTE) 0.1 0.0 - 0.4 x10E3/uL   Basophils Absolute 0.0 0.0 - 0.2 x10E3/uL    Immature Granulocytes 0 Not Estab. %   Immature Grans (Abs) 0.0 0.0 -  0.1 x10E3/uL  Comprehensive metabolic panel  Result Value Ref Range   Glucose 84 70 - 99 mg/dL   BUN 11 6 - 24 mg/dL   Creatinine, Ser 0.72 0.57 - 1.00 mg/dL   eGFR 108 >59 mL/min/1.73   BUN/Creatinine Ratio 15 9 - 23   Sodium 138 134 - 144 mmol/L   Potassium 3.7 3.5 - 5.2 mmol/L   Chloride 102 96 - 106 mmol/L   CO2 25 20 - 29 mmol/L   Calcium 9.3 8.7 - 10.2 mg/dL   Total Protein 6.5 6.0 - 8.5 g/dL   Albumin 4.4 3.8 - 4.8 g/dL   Globulin, Total 2.1 1.5 - 4.5 g/dL   Albumin/Globulin Ratio 2.1 1.2 - 2.2   Bilirubin Total <0.2 0.0 - 1.2 mg/dL   Alkaline Phosphatase 59 44 - 121 IU/L   AST 18 0 - 40 IU/L   ALT 18 0 - 32 IU/L  Gamma GT  Result Value Ref Range   GGT 14 0 - 60 IU/L  Lipase  Result Value Ref Range   Lipase 30 14 - 72 U/L  Amylase  Result Value Ref Range   Amylase 38 31 - 110 U/L      Assessment & Plan:   Problem List Items Addressed This Visit       Other   Depression - Primary   Other Visit Diagnoses     Annual physical exam       Screening for ischemic heart disease            Follow up plan: No follow-ups on file.   LABORATORY TESTING:  - Pap smear: {Blank GQBVQX:45038::"UEK done","not applicable","up to date","done elsewhere"}  IMMUNIZATIONS:   - Tdap: Tetanus vaccination status reviewed: {tetanus status:315746}. - Influenza: {Blank single:19197::"Up to date","Administered today","Postponed to flu season","Refused","Given elsewhere"} - Pneumovax: {Blank single:19197::"Up to date","Administered today","Not applicable","Refused","Given elsewhere"} - Prevnar: {Blank single:19197::"Up to date","Administered today","Not applicable","Refused","Given elsewhere"} - COVID: {Blank single:19197::"Up to date","Administered today","Not applicable","Refused","Given elsewhere"} - HPV: {Blank single:19197::"Up to date","Administered today","Not applicable","Refused","Given elsewhere"} -  Shingrix vaccine: {Blank single:19197::"Up to date","Administered today","Not applicable","Refused","Given elsewhere"}  SCREENING: -Mammogram: {Blank single:19197::"Up to date","Ordered today","Not applicable","Refused","Done elsewhere"}  - Colonoscopy: {Blank single:19197::"Up to date","Ordered today","Not applicable","Refused","Done elsewhere"}  - Bone Density: {Blank single:19197::"Up to date","Ordered today","Not applicable","Refused","Done elsewhere"}  -Hearing Test: {Blank single:19197::"Up to date","Ordered today","Not applicable","Refused","Done elsewhere"}  -Spirometry: {Blank single:19197::"Up to date","Ordered today","Not applicable","Refused","Done elsewhere"}   PATIENT COUNSELING:   Advised to take 1 mg of folate supplement per day if capable of pregnancy.   Sexuality: Discussed sexually transmitted diseases, partner selection, use of condoms, avoidance of unintended pregnancy  and contraceptive alternatives.   Advised to avoid cigarette smoking.  I discussed with the patient that most people either abstain from alcohol or drink within safe limits (<=14/week and <=4 drinks/occasion for males, <=7/weeks and <= 3 drinks/occasion for females) and that the risk for alcohol disorders and other health effects rises proportionally with the number of drinks per week and how often a drinker exceeds daily limits.  Discussed cessation/primary prevention of drug use and availability of treatment for abuse.   Diet: Encouraged to adjust caloric intake to maintain  or achieve ideal body weight, to reduce intake of dietary saturated fat and total fat, to limit sodium intake by avoiding high sodium foods and not adding table salt, and to maintain adequate dietary potassium and calcium preferably from fresh fruits, vegetables, and low-fat dairy products.    stressed the importance of regular exercise  Injury prevention: Discussed safety belts, safety  helmets, smoke detector, smoking near bedding  or upholstery.   Dental health: Discussed importance of regular tooth brushing, flossing, and dental visits.    NEXT PREVENTATIVE PHYSICAL DUE IN 1 YEAR. No follow-ups on file.

## 2022-08-21 ENCOUNTER — Encounter: Payer: Self-pay | Admitting: Nurse Practitioner

## 2022-08-21 ENCOUNTER — Other Ambulatory Visit (HOSPITAL_COMMUNITY)
Admission: RE | Admit: 2022-08-21 | Discharge: 2022-08-21 | Disposition: A | Discharge status: Home / Self Care | Payer: BC Managed Care – PPO | Source: Ambulatory Visit | Attending: Nurse Practitioner | Admitting: Nurse Practitioner

## 2022-08-21 ENCOUNTER — Ambulatory Visit (INDEPENDENT_AMBULATORY_CARE_PROVIDER_SITE_OTHER): Payer: BC Managed Care – PPO | Admitting: Nurse Practitioner

## 2022-08-21 VITALS — BP 124/81 | HR 69 | Temp 98.1°F | Ht 65.1 in | Wt 147.1 lb

## 2022-08-21 DIAGNOSIS — Z136 Encounter for screening for cardiovascular disorders: Secondary | ICD-10-CM

## 2022-08-21 DIAGNOSIS — Z0000 Encounter for general adult medical examination without abnormal findings: Secondary | ICD-10-CM | POA: Insufficient documentation

## 2022-08-21 DIAGNOSIS — M7711 Lateral epicondylitis, right elbow: Secondary | ICD-10-CM | POA: Diagnosis not present

## 2022-08-21 DIAGNOSIS — F330 Major depressive disorder, recurrent, mild: Secondary | ICD-10-CM

## 2022-08-21 LAB — MICROSCOPIC EXAMINATION: Bacteria, UA: NONE SEEN

## 2022-08-21 LAB — URINALYSIS, ROUTINE W REFLEX MICROSCOPIC
Bilirubin, UA: NEGATIVE
Glucose, UA: NEGATIVE
Ketones, UA: NEGATIVE
Leukocytes,UA: NEGATIVE
Nitrite, UA: NEGATIVE
Protein,UA: NEGATIVE
Specific Gravity, UA: 1.025 (ref 1.005–1.030)
Urobilinogen, Ur: 1 mg/dL (ref 0.2–1.0)
pH, UA: 6 (ref 5.0–7.5)

## 2022-08-21 MED ORDER — METHYLPREDNISOLONE 4 MG PO TBPK: ORAL_TABLET | ORAL | 0 refills | Status: DC

## 2022-08-21 MED ORDER — TOPIRAMATE 50 MG PO TABS: 50.0000 mg | ORAL_TABLET | Freq: Two times a day (BID) | ORAL | 1 refills | Status: DC

## 2022-08-21 MED ORDER — DROSPIRENONE-ETHINYL ESTRADIOL 3-0.03 MG PO TABS: 1.0000 | ORAL_TABLET | Freq: Every day | ORAL | 3 refills | Status: DC

## 2022-08-21 MED ORDER — BUPROPION HCL ER (XL) 300 MG PO TB24: 300.0000 mg | ORAL_TABLET | Freq: Every day | ORAL | 1 refills | Status: DC

## 2022-08-21 MED ORDER — ESCITALOPRAM OXALATE 10 MG PO TABS: 10.0000 mg | ORAL_TABLET | Freq: Every day | ORAL | 1 refills | Status: DC

## 2022-08-21 NOTE — Assessment & Plan Note (Signed)
Chronic.  Controlled.  Continue with current medication regimen of Wellbutrin and lexapro.  Refills sent today.  Labs ordered today.  Return to clinic in 6 months for reevaluation.  Call sooner if concerns arise.

## 2022-08-22 ENCOUNTER — Telehealth: Payer: Self-pay

## 2022-08-22 LAB — COMPREHENSIVE METABOLIC PANEL
ALT: 16 IU/L (ref 0–32)
AST: 19 IU/L (ref 0–40)
Albumin/Globulin Ratio: 1.9 (ref 1.2–2.2)
Albumin: 4.3 g/dL (ref 3.9–4.9)
Alkaline Phosphatase: 59 IU/L (ref 44–121)
BUN/Creatinine Ratio: 28 — ABNORMAL HIGH (ref 9–23)
BUN: 18 mg/dL (ref 6–24)
Bilirubin Total: <0.2 mg/dL (ref 0.0–1.2)
CO2: 23 mmol/L (ref 20–29)
Calcium: 9.4 mg/dL (ref 8.7–10.2)
Chloride: 100 mmol/L (ref 96–106)
Creatinine, Ser: 0.64 mg/dL (ref 0.57–1.00)
Globulin, Total: 2.3 g/dL (ref 1.5–4.5)
Glucose: 92 mg/dL (ref 70–99)
Potassium: 4 mmol/L (ref 3.5–5.2)
Sodium: 139 mmol/L (ref 134–144)
Total Protein: 6.6 g/dL (ref 6.0–8.5)
eGFR: 113 mL/min/{1.73_m2} (ref >59)

## 2022-08-22 LAB — TSH: TSH: 1.99 u[IU]/mL (ref 0.450–4.500)

## 2022-08-22 LAB — CBC WITH DIFFERENTIAL/PLATELET
Basophils Absolute: 0 10*3/uL (ref 0.0–0.2)
Basos: 0 %
EOS (ABSOLUTE): 0.1 10*3/uL (ref 0.0–0.4)
Eos: 2 %
Hematocrit: 35.9 % (ref 34.0–46.6)
Hemoglobin: 12.1 g/dL (ref 11.1–15.9)
Immature Grans (Abs): 0 10*3/uL (ref 0.0–0.1)
Immature Granulocytes: 0 %
Lymphocytes Absolute: 3.1 10*3/uL (ref 0.7–3.1)
Lymphs: 42 %
MCH: 31.9 pg (ref 26.6–33.0)
MCHC: 33.7 g/dL (ref 31.5–35.7)
MCV: 95 fL (ref 79–97)
Monocytes Absolute: 0.5 10*3/uL (ref 0.1–0.9)
Monocytes: 6 %
Neutrophils Absolute: 3.8 10*3/uL (ref 1.4–7.0)
Neutrophils: 50 %
Platelets: 269 10*3/uL (ref 150–450)
RBC: 3.79 x10E6/uL (ref 3.77–5.28)
RDW: 11.9 % (ref 11.7–15.4)
WBC: 7.6 10*3/uL (ref 3.4–10.8)

## 2022-08-22 LAB — LIPID PANEL
Chol/HDL Ratio: 1.8 ratio (ref 0.0–4.4)
Cholesterol, Total: 188 mg/dL (ref 100–199)
HDL: 107 mg/dL (ref >39)
LDL Chol Calc (NIH): 52 mg/dL (ref 0–99)
Triglycerides: 188 mg/dL — ABNORMAL HIGH (ref 0–149)
VLDL Cholesterol Cal: 29 mg/dL (ref 5–40)

## 2022-08-22 NOTE — Telephone Encounter (Signed)
Tammy Harper and scheduled patient's mammogram for 09/14/22 at 8:40 AM.   Tried calling patient to notify her of this appointment date and time, no answer and VM full.   OK for PEC to speak to patient and advise her of the appointment date, time, and location for her mammogram.

## 2022-08-22 NOTE — Progress Notes (Signed)
Hi Tammy Harper. It was nice to see you yesterday.  Your lab work looks good.  Your triglycerides are slightly elevated.  But if you weren't fasting that is why.  Otherwise, I recommend decreasing processed foods and refined sugar intake. No concerns at this time. Continue with your current medication regimen.  Follow up as discussed.  Please let me know if you have any questions.

## 2022-08-22 NOTE — Telephone Encounter (Signed)
-----   Message from Georgina Peer, [place] sent at 08/21/2022  3:42 PM EST ----- Schedule mammo, first, no problems,- mornings or Fridays

## 2022-08-28 LAB — CYTOLOGY - PAP: Diagnosis: NEGATIVE

## 2022-10-08 ENCOUNTER — Encounter: Payer: Self-pay | Admitting: Nurse Practitioner

## 2022-10-09 MED ORDER — VALACYCLOVIR HCL 1 G PO TABS: 1000.0000 mg | ORAL_TABLET | Freq: Two times a day (BID) | ORAL | 0 refills | Status: AC

## 2022-11-21 ENCOUNTER — Encounter: Payer: Self-pay | Admitting: Nurse Practitioner

## 2022-11-22 ENCOUNTER — Ambulatory Visit: Payer: BC Managed Care – PPO | Admitting: Nurse Practitioner

## 2022-11-22 ENCOUNTER — Encounter: Payer: Self-pay | Admitting: Nurse Practitioner

## 2022-11-22 VITALS — BP 127/77 | HR 71 | Temp 98.1°F | Wt 148.5 lb

## 2022-11-22 DIAGNOSIS — R61 Generalized hyperhidrosis: Secondary | ICD-10-CM

## 2022-11-22 DIAGNOSIS — R635 Abnormal weight gain: Secondary | ICD-10-CM | POA: Diagnosis not present

## 2022-11-22 MED ORDER — TOPIRAMATE 25 MG PO TABS: 25.0000 mg | ORAL_TABLET | Freq: Two times a day (BID) | ORAL | 0 refills | Status: DC

## 2022-11-22 NOTE — Telephone Encounter (Signed)
Appt has been made.

## 2022-11-22 NOTE — Progress Notes (Signed)
BP 127/77   Pulse 71   Temp 98.1 F (36.7 C) (Oral)   Wt 148 lb 8 oz (67.4 kg)   LMP 10/31/2022 (Exact Date)   SpO2 98%   BMI 24.64 kg/m    Subjective:    Patient ID: Tammy Harper, female    DOB: 25-May-1981, 42 y.o.   MRN: [id number]  HPI: Tammy Harper is a 42 y.o. female  Chief Complaint  Patient presents with   Night Sweats    Patient states she is getting up in the middle of the night eating.  Having night sweats and soaking through her pajamas.  She is feeling like topamax is making her more moody.  Does not feel like it is helping her appetite.     Relevant past medical, surgical, family and social history reviewed and updated as indicated. Interim medical history since our last visit reviewed. Allergies and medications reviewed and updated.  Review of Systems  Constitutional:        Night sweats  Psychiatric/Behavioral:         Moody    Per HPI unless specifically indicated above     Objective:    BP 127/77   Pulse 71   Temp 98.1 F (36.7 C) (Oral)   Wt 148 lb 8 oz (67.4 kg)   LMP 10/31/2022 (Exact Date)   SpO2 98%   BMI 24.64 kg/m   Wt Readings from Last 3 Encounters:  11/22/22 148 lb 8 oz (67.4 kg)  08/21/22 147 lb 1.6 oz (66.7 kg)  01/02/22 163 lb 6.4 oz (74.1 kg)    Physical Exam Vitals and nursing note reviewed.  Constitutional:      General: She is not in acute distress.    Appearance: Normal appearance. She is normal weight. She is not ill-appearing, toxic-appearing or diaphoretic.  HENT:     Head: Normocephalic.     Right Ear: External ear normal.     Left Ear: External ear normal.     Nose: Nose normal.     Mouth/Throat:     Mouth: Mucous membranes are moist.     Pharynx: Oropharynx is clear.  Eyes:     General:        Right eye: No discharge.        Left eye: No discharge.     Extraocular Movements: Extraocular movements intact.     Conjunctiva/sclera: Conjunctivae normal.     Pupils: Pupils are equal, round, and  reactive to light.  Cardiovascular:     Rate and Rhythm: Normal rate and regular rhythm.     Heart sounds: No murmur heard. Pulmonary:     Effort: Pulmonary effort is normal. No respiratory distress.     Breath sounds: Normal breath sounds. No wheezing or rales.  Musculoskeletal:     Cervical back: Normal range of motion and neck supple.  Skin:    General: Skin is warm and dry.     Capillary Refill: Capillary refill takes less than 2 seconds.  Neurological:     General: No focal deficit present.     Mental Status: She is alert and oriented to person, place, and time. Mental status is at baseline.  Psychiatric:        Mood and Affect: Mood normal.        Behavior: Behavior normal.        Thought Content: Thought content normal.        Judgment: Judgment normal.     Results  for orders placed or performed in visit on 11/22/22  Thyroid Panel With TSH  Result Value Ref Range   TSH 1.090 0.450 - 4.500 uIU/mL   T4, Total 7.3 4.5 - 12.0 ug/dL   T3 Uptake Ratio 19 (L) 24 - 39 %   Free Thyroxine Index 1.4 1.2 - 4.9  T4, free  Result Value Ref Range   Free T4 0.95 0.82 - 1.77 ng/dL  FSH/LH  Result Value Ref Range   LH 0.9 mIU/mL   FSH 2.1 mIU/mL  Estrogens, Total  Result Value Ref Range   Estrogen WILL FOLLOW       Assessment & Plan:   Problem List Items Addressed This Visit   None Visit Diagnoses     Night sweats    -  Primary   Labs ordered during visit to rule out thyroid concern as well as hormones.  Will make recommendations based on lab results.   Relevant Orders   Thyroid Panel With TSH (Completed)   T4, free (Completed)   FSH/LH (Completed)   Estrogens, Total (Completed)   Weight gain   Not seeing an improvement with Topamax. Will work to wean down medication. Recommend taking Topamax 25mg  BID for two weeks then decreasing to 25mg  daily for two weeks. Then okay to stop medication.  Follow up in 1 month for reevaluation.      Relevant Orders   Thyroid Panel With  TSH (Completed)   T4, free (Completed)   FSH/LH (Completed)   Estrogens, Total (Completed)        Follow up plan: Return in about 1 month (around 12/23/2022) for Medication Management(virtual).

## 2022-11-25 LAB — FSH/LH
FSH: 2.1 m[IU]/mL
LH: 0.9 m[IU]/mL

## 2022-11-25 LAB — T4, FREE: Free T4: 0.95 ng/dL (ref 0.82–1.77)

## 2022-11-25 LAB — THYROID PANEL WITH TSH
Free Thyroxine Index: 1.4 (ref 1.2–4.9)
T3 Uptake Ratio: 19 % — ABNORMAL LOW (ref 24–39)
T4, Total: 7.3 ug/dL (ref 4.5–12.0)
TSH: 1.09 u[IU]/mL (ref 0.450–4.500)

## 2022-11-25 LAB — ESTROGENS, TOTAL: Estrogen: 48 pg/mL

## 2022-11-26 NOTE — Progress Notes (Signed)
Hi Tammy Harper. Your thyroid labs look good.  No concerns at this time.  You do not appear to be in menopause either.  Let's see if stopping the Topamax will improve your symptoms.

## 2022-12-20 ENCOUNTER — Encounter: Payer: Self-pay | Admitting: Nurse Practitioner

## 2022-12-21 MED ORDER — VALACYCLOVIR HCL 1 G PO TABS: 1000.0000 mg | ORAL_TABLET | Freq: Two times a day (BID) | ORAL | 1 refills | Status: DC

## 2022-12-28 ENCOUNTER — Ambulatory Visit: Payer: BC Managed Care – PPO | Admitting: Nurse Practitioner

## 2022-12-28 NOTE — Progress Notes (Deleted)
There were no vitals taken for this visit.   Subjective:    Patient ID: Tammy Harper, female    DOB: 16-Oct-1980, 43 y.o.   MRN: [id number]  HPI: Tammy Harper is a 42 y.o. female  No chief complaint on file.   Patient states she is getting up in the middle of the night eating.  Having night sweats and soaking through her pajamas.  She is feeling like topamax is making her more moody.  Does not feel like it is helping her appetite.     Relevant past medical, surgical, family and social history reviewed and updated as indicated. Interim medical history since our last visit reviewed. Allergies and medications reviewed and updated.  Review of Systems  Constitutional:        Night sweats  Psychiatric/Behavioral:         Moody    Per HPI unless specifically indicated above     Objective:    There were no vitals taken for this visit.  Wt Readings from Last 3 Encounters:  11/22/22 148 lb 8 oz (67.4 kg)  08/21/22 147 lb 1.6 oz (66.7 kg)  01/02/22 163 lb 6.4 oz (74.1 kg)    Physical Exam Vitals and nursing note reviewed.  Constitutional:      General: She is not in acute distress.    Appearance: Normal appearance. She is normal weight. She is not ill-appearing, toxic-appearing or diaphoretic.  HENT:     Head: Normocephalic.     Right Ear: External ear normal.     Left Ear: External ear normal.     Nose: Nose normal.     Mouth/Throat:     Mouth: Mucous membranes are moist.     Pharynx: Oropharynx is clear.  Eyes:     General:        Right eye: No discharge.        Left eye: No discharge.     Extraocular Movements: Extraocular movements intact.     Conjunctiva/sclera: Conjunctivae normal.     Pupils: Pupils are equal, round, and reactive to light.  Cardiovascular:     Rate and Rhythm: Normal rate and regular rhythm.     Heart sounds: No murmur heard. Pulmonary:     Effort: Pulmonary effort is normal. No respiratory distress.     Breath sounds: Normal  breath sounds. No wheezing or rales.  Musculoskeletal:     Cervical back: Normal range of motion and neck supple.  Skin:    General: Skin is warm and dry.     Capillary Refill: Capillary refill takes less than 2 seconds.  Neurological:     General: No focal deficit present.     Mental Status: She is alert and oriented to person, place, and time. Mental status is at baseline.  Psychiatric:        Mood and Affect: Mood normal.        Behavior: Behavior normal.        Thought Content: Thought content normal.        Judgment: Judgment normal.    Results for orders placed or performed in visit on 11/22/22  Thyroid Panel With TSH  Result Value Ref Range   TSH 1.090 0.450 - 4.500 uIU/mL   T4, Total 7.3 4.5 - 12.0 ug/dL   T3 Uptake Ratio 19 (L) 24 - 39 %   Free Thyroxine Index 1.4 1.2 - 4.9  T4, free  Result Value Ref Range   Free T4 0.95  0.82 - 1.77 ng/dL  FSH/LH  Result Value Ref Range   LH 0.9 mIU/mL   FSH 2.1 mIU/mL  Estrogens, Total  Result Value Ref Range   Estrogen 48 pg/mL      Assessment & Plan:   Problem List Items Addressed This Visit   None Visit Diagnoses     Night sweats    -  Primary   Labs ordered during visit to rule out thyroid concern as well as hormones.  Will make recommendations based on lab results.   Relevant Orders   Thyroid Panel With TSH (Completed)   T4, free (Completed)   FSH/LH (Completed)   Estrogens, Total (Completed)   Weight gain   Not seeing an improvement with Topamax. Will work to wean down medication. Recommend taking Topamax 25mg  BID for two weeks then decreasing to 25mg  daily for two weeks. Then okay to stop medication.  Follow up in 1 month for reevaluation.      Relevant Orders   Thyroid Panel With TSH (Completed)   T4, free (Completed)   FSH/LH (Completed)   Estrogens, Total (Completed)        Follow up plan: No follow-ups on file.

## 2023-01-03 ENCOUNTER — Encounter: Payer: Self-pay | Admitting: Nurse Practitioner

## 2023-02-07 ENCOUNTER — Encounter: Payer: Self-pay | Admitting: Nurse Practitioner

## 2023-02-20 ENCOUNTER — Ambulatory Visit: Payer: BC Managed Care – PPO | Admitting: Nurse Practitioner

## 2023-02-21 ENCOUNTER — Telehealth: Payer: Self-pay

## 2023-02-21 MED ORDER — DROSPIRENONE-ETHINYL ESTRADIOL 3-0.03 MG PO TABS: 1.0000 | ORAL_TABLET | Freq: Every day | ORAL | 3 refills | Status: DC

## 2023-02-21 NOTE — Telephone Encounter (Signed)
Alternative sent to the pharmacy.

## 2023-02-21 NOTE — Telephone Encounter (Signed)
Attempted to reach pt regarding medication change. No answer and full VM

## 2023-02-21 NOTE — Telephone Encounter (Signed)
Pharmacy is requesting alternative for Yasmin tablets 28 preferred alternative is Drosprenoneethinyle, [place], [place], Zumandimine.

## 2023-02-25 NOTE — Telephone Encounter (Signed)
Called and LVM notifying patient of medication change.  

## 2023-04-28 ENCOUNTER — Other Ambulatory Visit: Payer: Self-pay | Admitting: Nurse Practitioner

## 2023-04-30 NOTE — Telephone Encounter (Signed)
Requested medication (s) are due for refill today: routing for review  Requested medication (s) are on the active medication list: no  Last refill:  12/21/22  Future visit scheduled: yes  Notes to clinic:  Unable to refill per protocol, Rx expired.  Medication is not on current list, routing for approval.     Requested Prescriptions  Pending Prescriptions Disp Refills   valACYclovir (VALTREX) 1000 MG tablet [Pharmacy Med Name: VALACYCLOVIR 1GM TABLETS] 20 tablet 1    Sig: TAKE 1 TABLET(1000 MG) BY MOUTH TWICE DAILY FOR 10 DAYS     Antimicrobials:  Antiviral Agents - Anti-Herpetic Passed - 04/28/2023  7:56 AM      Passed - Valid encounter within last 12 months    Recent Outpatient Visits           5 months ago Night sweats   [facility] Larae Grooms, NP   8 months ago Annual physical exam   [facility] Larae Grooms, NP   9 months ago Encounter for screening mammogram for malignant neoplasm of breast   [facility] Larae Grooms, NP   1 year ago RUQ pain   [place] [facility] [place], Corrie Dandy T, NP   1 year ago Rash of face   [place] [facility] Larae Grooms, NP

## 2023-06-14 ENCOUNTER — Other Ambulatory Visit: Payer: Self-pay | Admitting: Nurse Practitioner

## 2023-06-14 NOTE — Telephone Encounter (Signed)
Requested Prescriptions  Pending Prescriptions Disp Refills   buPROPion (WELLBUTRIN XL) 300 MG 24 hr tablet [Pharmacy Med Name: BUPROPION XL 300MG  TABLETS] 90 tablet 0    Sig: TAKE 1 TABLET(300 MG) BY MOUTH DAILY     Psychiatry: Antidepressants - bupropion Failed - 06/14/2023  5:14 PM      Failed - Valid encounter within last 6 months    Recent Outpatient Visits           6 months ago Night sweats   [facility] Larae Grooms, NP   9 months ago Annual physical exam   [place] [facility] Larae Grooms, NP   10 months ago Encounter for screening mammogram for malignant neoplasm of breast   [place] [facility] Larae Grooms, NP   1 year ago RUQ pain   [place] [facility] [place], Corrie Dandy T, NP   1 year ago Rash of face   [facility] Larae Grooms, NP              Passed - Cr in normal range and within 360 days    Creatinine  Date Value Ref Range Status  12/22/2011 0.61 0.60 - 1.30 mg/dL Final   Creatinine, Ser  Date Value Ref Range Status  08/21/2022 0.64 0.57 - 1.00 mg/dL Final         Passed - AST in normal range and within 360 days    AST  Date Value Ref Range Status  08/21/2022 19 0 - 40 IU/L Final         Passed - ALT in normal range and within 360 days    ALT  Date Value Ref Range Status  08/21/2022 16 0 - 32 IU/L Final         Passed - Completed PHQ-2 or PHQ-9 in the last 360 days      Passed - Last BP in normal range    BP Readings from Last 1 Encounters:  11/22/22 127/77

## 2023-06-20 ENCOUNTER — Encounter: Payer: Self-pay | Admitting: Nurse Practitioner

## 2023-06-21 ENCOUNTER — Ambulatory Visit: Payer: BC Managed Care – PPO | Admitting: Nurse Practitioner

## 2023-06-21 NOTE — Progress Notes (Deleted)
   There were no vitals taken for this visit.   Subjective:    Patient ID: Tammy Harper, female    DOB: 1981/03/29, 42 y.o.   MRN: [id number]  HPI: Tammy Harper is a 42 y.o. female  No chief complaint on file.  URINARY SYMPTOMS  Dysuria: {Blank single:19197::"yes","no","burning"} Urinary frequency: {Blank single:19197::"yes","no"} Urgency: {Blank single:19197::"yes","no"} Small volume voids: {Blank single:19197::"yes","no"} Symptom severity: {Blank single:19197::"yes","no"} Urinary incontinence: {Blank single:19197::"yes","no"} Foul odor: {Blank single:19197::"yes","no"} Hematuria: {Blank single:19197::"yes","no"} Abdominal pain: {Blank single:19197::"yes","no"} Back pain: {Blank single:19197::"yes","no"} Suprapubic pain/pressure: {Blank single:19197::"yes","no"} Flank pain: {Blank single:19197::"yes","no"} Fever:  {Blank multiple:19196::"yes","no","subjective","low grade"} Vomiting: {Blank single:19197::"yes","no"} Relief with cranberry juice: {Blank single:19197::"yes","no"} Relief with pyridium: {Blank single:19197::"yes","no"} Status: better/worse/stable Previous urinary tract infection: {Blank single:19197::"yes","no"} Recurrent urinary tract infection: {Blank single:19197::"yes","no"} Sexual activity: No sexually active/monogomous/practicing safe sex History of sexually transmitted disease: {Blank single:19197::"yes","no"} Penile discharge: {Blank single:19197::"yes","no"} Treatments attempted: {Blank multiple:19196::"none","antibiotics","pyridium","cranberry","increasing fluids"}    Relevant past medical, surgical, family and social history reviewed and updated as indicated. Interim medical history since our last visit reviewed. Allergies and medications reviewed and updated.  Review of Systems  Per HPI unless specifically indicated above     Objective:    There were no vitals taken for this visit.  Wt Readings from Last 3 Encounters:  11/22/22  148 lb 8 oz (67.4 kg)  08/21/22 147 lb 1.6 oz (66.7 kg)  01/02/22 163 lb 6.4 oz (74.1 kg)    Physical Exam  Results for orders placed or performed in visit on 11/22/22  Thyroid Panel With TSH  Result Value Ref Range   TSH 1.090 0.450 - 4.500 uIU/mL   T4, Total 7.3 4.5 - 12.0 ug/dL   T3 Uptake Ratio 19 (L) 24 - 39 %   Free Thyroxine Index 1.4 1.2 - 4.9  T4, free  Result Value Ref Range   Free T4 0.95 0.82 - 1.77 ng/dL  FSH/LH  Result Value Ref Range   LH 0.9 mIU/mL   FSH 2.1 mIU/mL  Estrogens, Total  Result Value Ref Range   Estrogen 48 pg/mL      Assessment & Plan:   Problem List Items Addressed This Visit   None    Follow up plan: No follow-ups on file.

## 2023-06-24 ENCOUNTER — Ambulatory Visit: Payer: BC Managed Care – PPO | Admitting: Family Medicine

## 2023-06-24 VITALS — BP 125/84 | HR 69 | Temp 97.6°F | Ht 65.35 in | Wt 129.8 lb

## 2023-06-24 DIAGNOSIS — N760 Acute vaginitis: Secondary | ICD-10-CM

## 2023-06-24 DIAGNOSIS — B9689 Other specified bacterial agents as the cause of diseases classified elsewhere: Secondary | ICD-10-CM | POA: Diagnosis not present

## 2023-06-24 DIAGNOSIS — R300 Dysuria: Secondary | ICD-10-CM

## 2023-06-24 LAB — WET PREP FOR TRICH, YEAST, CLUE
Clue Cell Exam: POSITIVE — AB
Trichomonas Exam: NEGATIVE
Yeast Exam: NEGATIVE

## 2023-06-24 LAB — URINALYSIS, ROUTINE W REFLEX MICROSCOPIC
Bilirubin, UA: NEGATIVE
Glucose, UA: NEGATIVE
Leukocytes,UA: NEGATIVE
Nitrite, UA: NEGATIVE
Protein,UA: NEGATIVE
Specific Gravity, UA: 1.025 (ref 1.005–1.030)
Urobilinogen, Ur: 0.2 mg/dL (ref 0.2–1.0)
pH, UA: 6 (ref 5.0–7.5)

## 2023-06-24 LAB — MICROSCOPIC EXAMINATION: Bacteria, UA: NONE SEEN

## 2023-06-24 MED ORDER — METRONIDAZOLE 500 MG PO TABS: 500.0000 mg | ORAL_TABLET | Freq: Two times a day (BID) | ORAL | 0 refills | Status: AC

## 2023-06-24 NOTE — Progress Notes (Addendum)
BP 125/84   Pulse 69   Temp 97.6 F (36.4 C) (Oral)   Ht 5' 5.35" (1.66 m)   Wt 129 lb 12.8 oz (58.9 kg)   BMI 21.37 kg/m    Subjective:    Patient ID: Tammy Harper, female    DOB: Aug 11, 1981, 42 y.o.   MRN: [id number]  HPI: Tammy Harper is a 42 y.o. female  Chief Complaint  Patient presents with   Dysuria   URINARY SYMPTOMS Started 4 days ago, worsened at night. Admits to not drinking enough water. Denies itching. Has been on semaglutide.  Dysuria: yes Urinary frequency: yes Urgency: yes Small volume voids: no Symptom severity: yes Urinary incontinence: no Foul odor: no Hematuria: no Abdominal pain: no Back pain: no Suprapubic pain/pressure: no Flank pain: no Fever:  no Vomiting: no Relief with cranberry juice:  Has not tried Relief with pyridium:  Has not tired Status: stable Previous urinary tract infection: no Recurrent urinary tract infection: no Sexual activity: No sexually active/monogomous/practicing safe sex History of sexually transmitted disease: no Treatments attempted:  ibuprofen for discomfort.      Relevant past medical, surgical, family and social history reviewed and updated as indicated. Interim medical history since our last visit reviewed. Allergies and medications reviewed and updated.  Review of Systems  Constitutional:  Negative for chills and fever.  Respiratory: Negative.    Cardiovascular: Negative.   Gastrointestinal:  Negative for vomiting.  Genitourinary:  Positive for dysuria, frequency and urgency. Negative for decreased urine volume, difficulty urinating, dyspareunia, flank pain, hematuria and pelvic pain.  Musculoskeletal:  Negative for back pain.    Per HPI unless specifically indicated above     Objective:    BP 125/84   Pulse 69   Temp 97.6 F (36.4 C) (Oral)   Ht 5' 5.35" (1.66 m)   Wt 129 lb 12.8 oz (58.9 kg)   BMI 21.37 kg/m   Wt Readings from Last 3 Encounters:  06/24/23 129 lb 12.8 oz (58.9  kg)  11/22/22 148 lb 8 oz (67.4 kg)  08/21/22 147 lb 1.6 oz (66.7 kg)    Physical Exam Vitals and nursing note reviewed.  Constitutional:      General: She is awake. She is not in acute distress.    Appearance: Normal appearance. She is well-developed and well-groomed. She is not ill-appearing.  HENT:     Head: Normocephalic and atraumatic.     Right Ear: Hearing and external ear normal. No drainage.     Left Ear: Hearing and external ear normal. No drainage.     Nose: Nose normal.  Eyes:     General: Lids are normal.        Right eye: No discharge.        Left eye: No discharge.     Conjunctiva/sclera: Conjunctivae normal.  Cardiovascular:     Rate and Rhythm: Normal rate and regular rhythm.     Pulses:          Radial pulses are 2+ on the right side and 2+ on the left side.       Posterior tibial pulses are 2+ on the right side and 2+ on the left side.     Heart sounds: Normal heart sounds, S1 normal and S2 normal. No murmur heard.    No gallop.  Pulmonary:     Effort: Pulmonary effort is normal. No accessory muscle usage or respiratory distress.     Breath sounds: Normal breath sounds.  Musculoskeletal:        General: Normal range of motion.     Cervical back: Full passive range of motion without pain and normal range of motion.     Right lower leg: No edema.     Left lower leg: No edema.  Skin:    General: Skin is warm and dry.     Capillary Refill: Capillary refill takes less than 2 seconds.  Neurological:     Mental Status: She is alert and oriented to person, place, and time.  Psychiatric:        Attention and Perception: Attention normal.        Mood and Affect: Mood normal.        Speech: Speech normal.        Behavior: Behavior normal. Behavior is cooperative.        Thought Content: Thought content normal.    Results for orders placed or performed in visit on 11/22/22  Thyroid Panel With TSH  Result Value Ref Range   TSH 1.090 0.450 - 4.500 uIU/mL   T4,  Total 7.3 4.5 - 12.0 ug/dL   T3 Uptake Ratio 19 (L) 24 - 39 %   Free Thyroxine Index 1.4 1.2 - 4.9  T4, free  Result Value Ref Range   Free T4 0.95 0.82 - 1.77 ng/dL  FSH/LH  Result Value Ref Range   LH 0.9 mIU/mL   FSH 2.1 mIU/mL  Estrogens, Total  Result Value Ref Range   Estrogen 48 pg/mL      Assessment & Plan:   Problem List Items Addressed This Visit     Dysuria - Primary    Acute, stable. UA -, Wet Prep + for BV, will send urine culture. Will treat with Metronidazole 500 MG q12 for 7 days. Recommend increase water intake to 65 oz daily.       Relevant Orders   Urinalysis, Routine w reflex microscopic   Urine Culture   WET PREP FOR TRICH, YEAST, CLUE   Other Visit Diagnoses     Bacterial vaginosis       Acute, stable. Wet prep +. Will treat with Metronidazole 500 MG q12 for 7 days.   Relevant Medications   metroNIDAZOLE (FLAGYL) 500 MG tablet        Follow up plan: Return if symptoms worsen or fail to improve, for Follow up mood.

## 2023-06-24 NOTE — Assessment & Plan Note (Addendum)
Acute, stable. UA -, Wet Prep + for BV, will send urine culture. Will treat with Metronidazole 500 MG q12 for 7 days. Recommend increase water intake to 65 oz daily.

## 2023-06-24 NOTE — Patient Instructions (Addendum)
Increase water intake to 65 ounces daily  Avoid sex until finishing treatment

## 2023-06-25 ENCOUNTER — Encounter: Payer: Self-pay | Admitting: Nurse Practitioner

## 2023-06-26 MED ORDER — NITROFURANTOIN MONOHYD MACRO 100 MG PO CAPS: 100.0000 mg | ORAL_CAPSULE | Freq: Two times a day (BID) | ORAL | 0 refills | Status: DC

## 2023-06-27 LAB — URINE CULTURE

## 2023-07-09 NOTE — Progress Notes (Deleted)
   There were no vitals taken for this visit.   Subjective:    Patient ID: Aliana Kreischer Youngblood, female    DOB: 05-05-1981, 42 y.o.   MRN: [id number]  HPI: Pippa Hanif is a 42 y.o. female  No chief complaint on file.  MOOD   Relevant past medical, surgical, family and social history reviewed and updated as indicated. Interim medical history since our last visit reviewed. Allergies and medications reviewed and updated.  Review of Systems  Per HPI unless specifically indicated above     Objective:    There were no vitals taken for this visit.  Wt Readings from Last 3 Encounters:  06/24/23 129 lb 12.8 oz (58.9 kg)  11/22/22 148 lb 8 oz (67.4 kg)  08/21/22 147 lb 1.6 oz (66.7 kg)    Physical Exam  Results for orders placed or performed in visit on 06/24/23  Urine Culture   Specimen: Urine   UR  Result Value Ref Range   Urine Culture, Routine Final report    Organism ID, Bacteria Comment   WET PREP FOR TRICH, YEAST, CLUE   Specimen: Urine   Urine  Result Value Ref Range   Trichomonas Exam Negative Negative   Yeast Exam Negative Negative   Clue Cell Exam Positive (A) Negative  Microscopic Examination   Urine  Result Value Ref Range   WBC, UA 0-5 0 - 5 /hpf   RBC, Urine 0-2 0 - 2 /hpf   Epithelial Cells (non renal) 0-10 0 - 10 /hpf   Mucus, UA Present (A) Not Estab.   Bacteria, UA None seen None seen/Few  Urinalysis, Routine w reflex microscopic  Result Value Ref Range   Specific Gravity, UA 1.025 1.005 - 1.030   pH, UA 6.0 5.0 - 7.5   Color, UA Yellow Yellow   Appearance Ur Clear Clear   Leukocytes,UA Negative Negative   Protein,UA Negative Negative/Trace   Glucose, UA Negative Negative   Ketones, UA Trace (A) Negative   RBC, UA Trace (A) Negative   Bilirubin, UA Negative Negative   Urobilinogen, Ur 0.2 0.2 - 1.0 mg/dL   Nitrite, UA Negative Negative   Microscopic Examination See below:       Assessment & Plan:   Problem List Items Addressed  This Visit       Other   Depression - Primary     Follow up plan: No follow-ups on file.

## 2023-07-10 ENCOUNTER — Ambulatory Visit: Payer: BC Managed Care – PPO | Admitting: Nurse Practitioner

## 2023-07-10 DIAGNOSIS — F330 Major depressive disorder, recurrent, mild: Secondary | ICD-10-CM

## 2023-07-15 IMAGING — US US ABDOMEN LIMITED
1 series · 14 of 25 positions shown · non-contrast
Comparison: CT abdomen 10/03/2020

CLINICAL DATA: Right upper quadrant pain

EXAM:
ULTRASOUND ABDOMEN LIMITED RIGHT UPPER QUADRANT

[Series 1: us abdomen limited · 0.20mm/px · 14 of 54 slices shown]
[im 1/54]
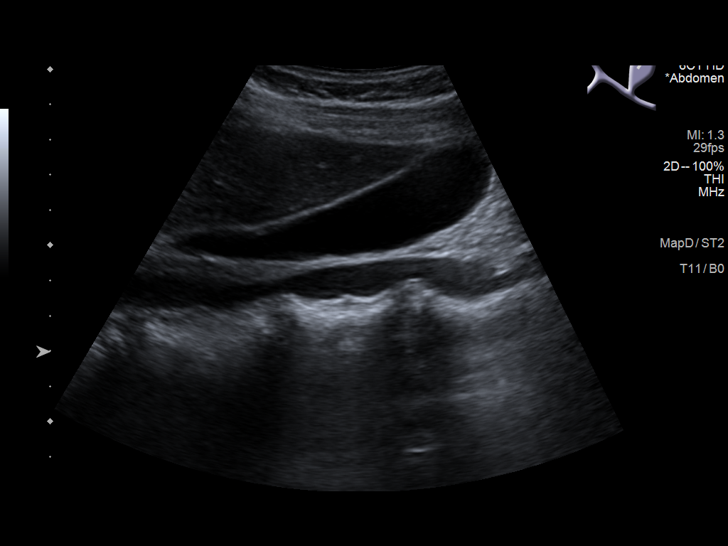
[im 5/54]
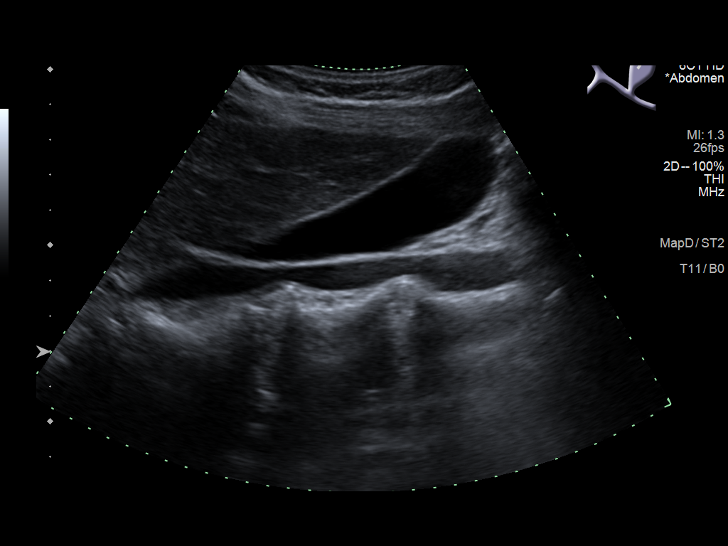
[im 9/54]
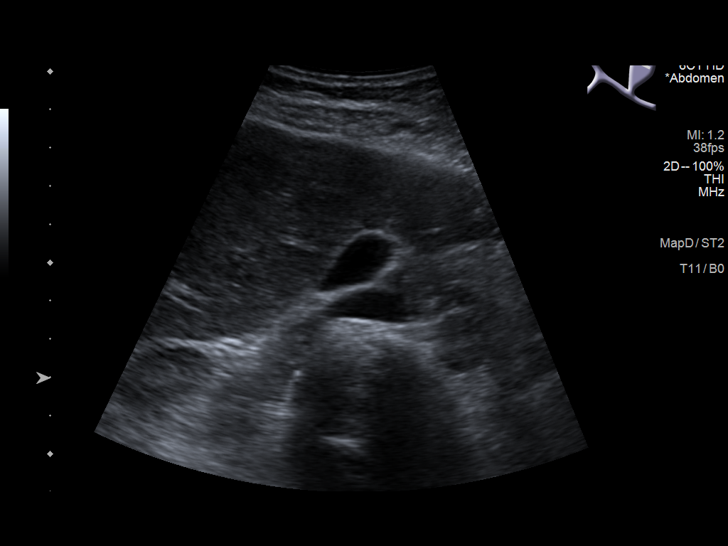
[im 14/54]
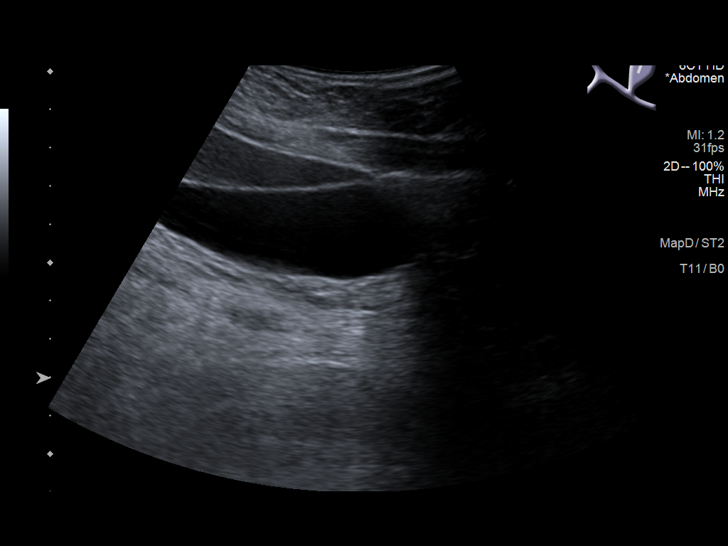
[im 18/54]
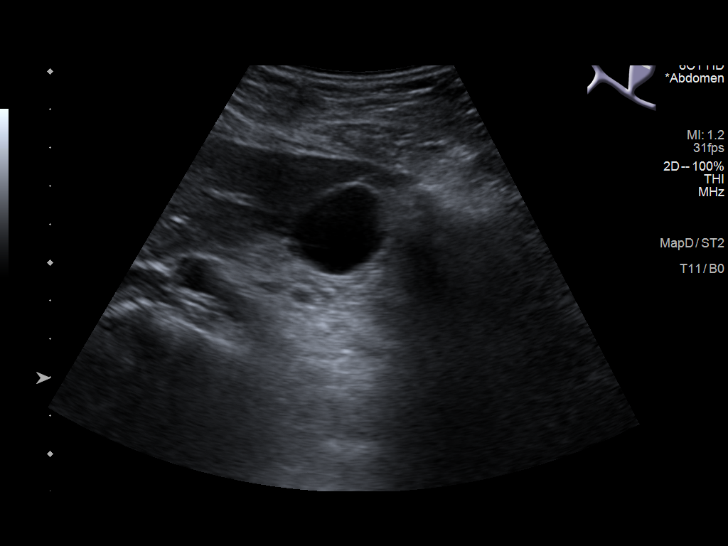
[im 20/54]
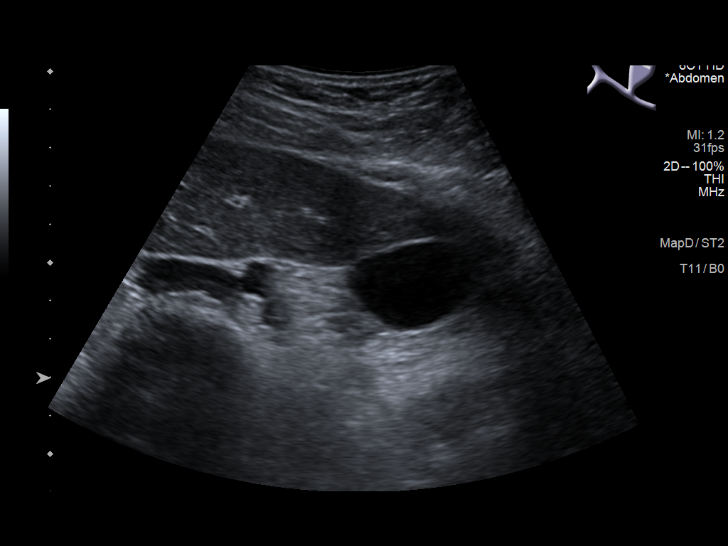
[im 25/54]
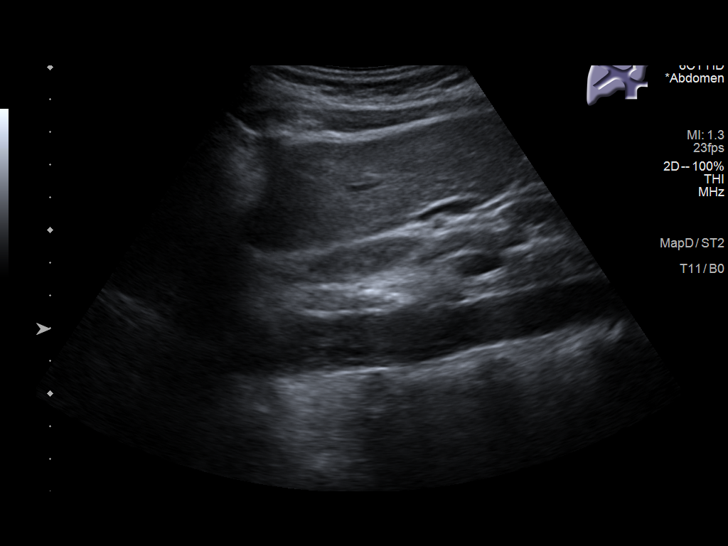
[im 29/54]
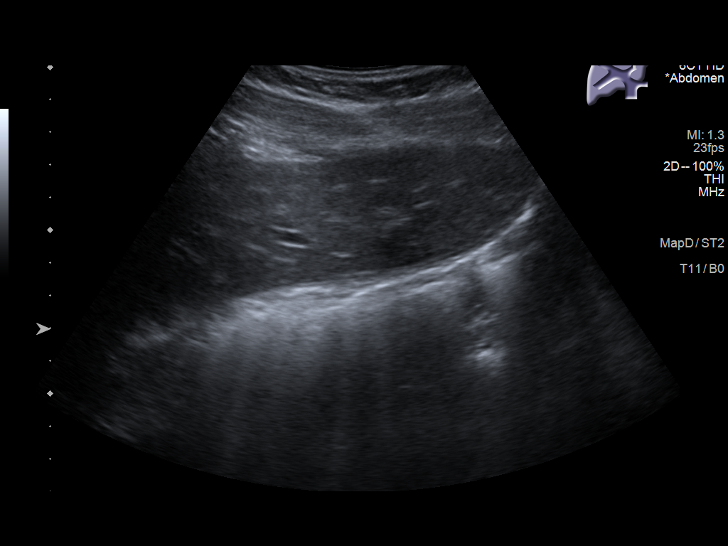
[im 34/54]
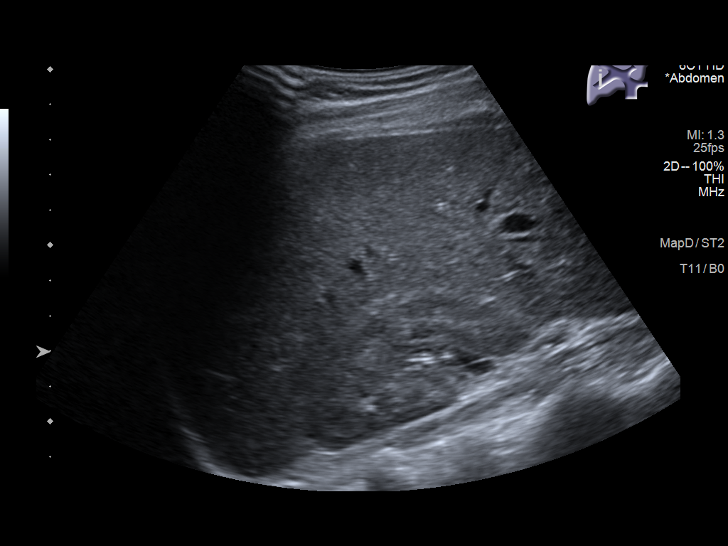
[im 36/54]
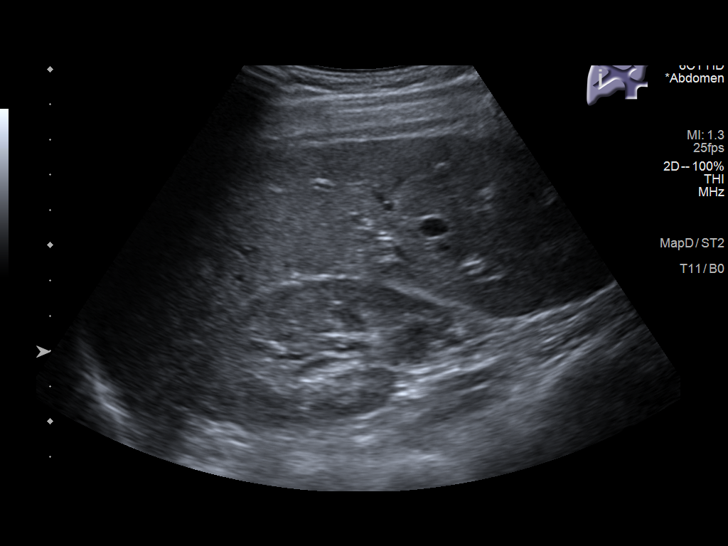
[im 40/54]
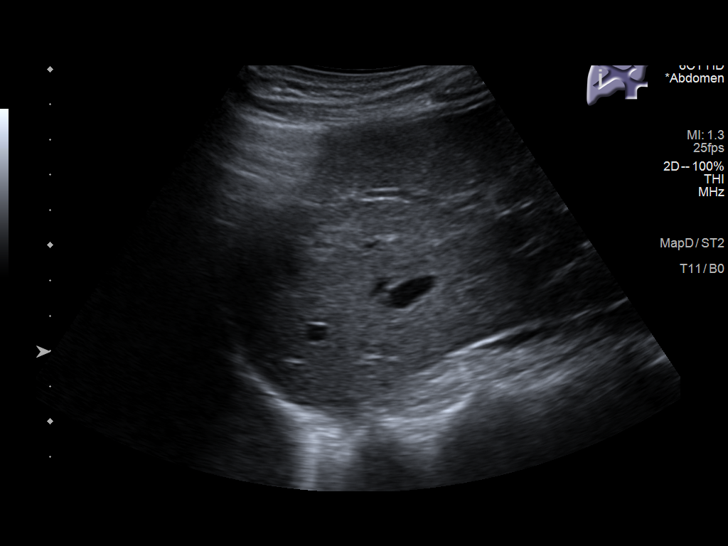
[im 45/54]
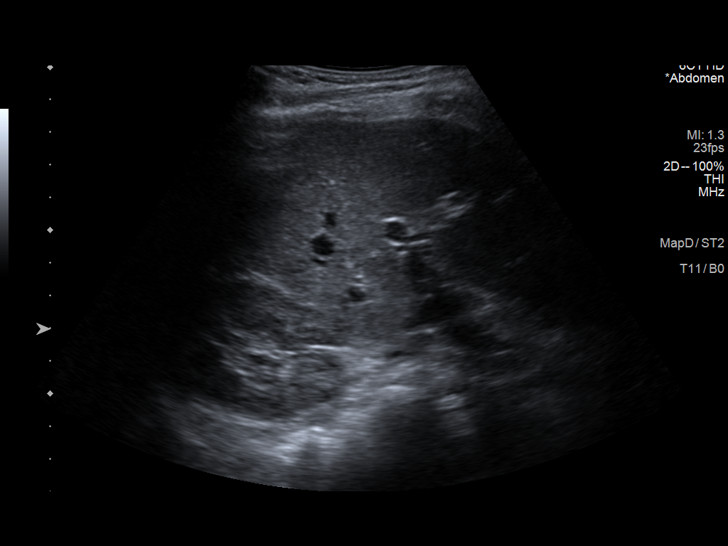
[im 49/54]
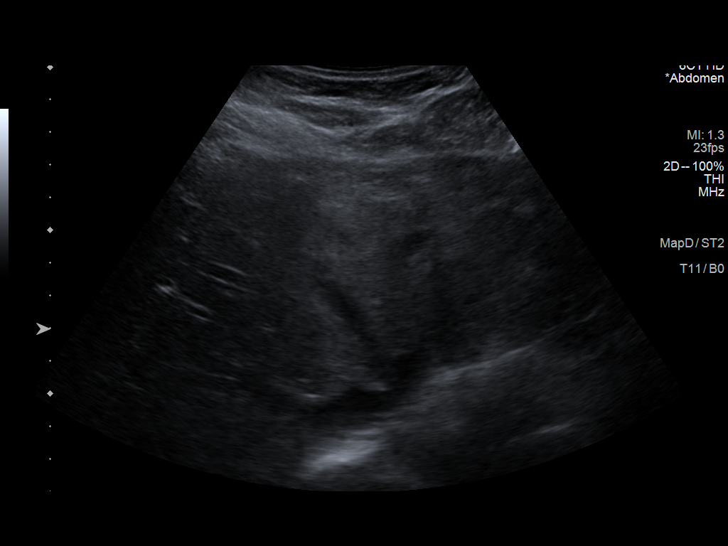
[im 54/54]
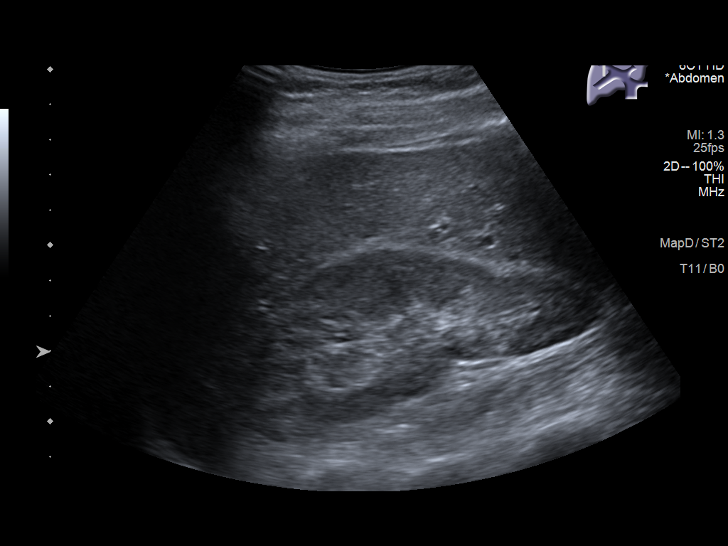

[14 of 25 positions shown; findings below may reference images not displayed]

FINDINGS: Gallbladder:

No gallstones or wall thickening visualized. The patient was tender
over the gallbladder during scanning.

Common bile duct:

Diameter: 2.5 mm

Liver:

No focal lesion identified. Within normal limits in parenchymal
echogenicity. Portal vein is patent on color Doppler imaging with
normal direction of blood flow towards the liver.

Other: None.
IMPRESSION: Negative for gallstones however the patient is tender in the right
upper quadrant. No biliary dilatation.

## 2023-07-26 ENCOUNTER — Encounter: Payer: Self-pay | Admitting: Nurse Practitioner

## 2023-07-29 ENCOUNTER — Ambulatory Visit: Payer: BC Managed Care – PPO | Admitting: Family Medicine

## 2023-07-29 ENCOUNTER — Ambulatory Visit: Payer: Self-pay

## 2023-07-29 ENCOUNTER — Encounter: Payer: Self-pay | Admitting: Nurse Practitioner

## 2023-07-29 VITALS — BP 117/82 | HR 72 | Temp 97.5°F | Ht 65.75 in | Wt 136.0 lb

## 2023-07-29 DIAGNOSIS — B9689 Other specified bacterial agents as the cause of diseases classified elsewhere: Secondary | ICD-10-CM | POA: Insufficient documentation

## 2023-07-29 DIAGNOSIS — N760 Acute vaginitis: Secondary | ICD-10-CM | POA: Diagnosis not present

## 2023-07-29 DIAGNOSIS — R300 Dysuria: Secondary | ICD-10-CM | POA: Diagnosis not present

## 2023-07-29 LAB — MICROSCOPIC EXAMINATION: Bacteria, UA: NONE SEEN

## 2023-07-29 LAB — URINALYSIS, ROUTINE W REFLEX MICROSCOPIC: Specific Gravity, UA: 1.01 (ref 1.005–1.030)

## 2023-07-29 LAB — WET PREP FOR TRICH, YEAST, CLUE
Clue Cell Exam: POSITIVE — AB
Trichomonas Exam: NEGATIVE
Yeast Exam: NEGATIVE

## 2023-07-29 MED ORDER — VALACYCLOVIR HCL 1 G PO TABS: 1000.0000 mg | ORAL_TABLET | Freq: Two times a day (BID) | ORAL | 1 refills | Status: DC

## 2023-07-29 MED ORDER — METRONIDAZOLE 0.75 % VA GEL: 1.0000 | Freq: Every day | VAGINAL | 0 refills | Status: AC

## 2023-07-29 NOTE — Assessment & Plan Note (Addendum)
Acute, ongoing. UA negative, will send for culture, wet prep + for BV. Will treat with Flagyl gel nightly for 5 days. Recommend increased water intake to 70 oz daily. Return if symptoms do not improve.

## 2023-07-29 NOTE — Telephone Encounter (Signed)
  Chief Complaint: Urinary frequency and burning sensation Symptoms: above Frequency:  Pertinent Negatives: Patient denies fever, discharge Disposition: [] ED /[] Urgent Care (no appt availability in office) / [x] Appointment(In office/virtual)/ []  [facility]/ [] Home Care/ [] Refused Recommended Disposition /[] [facility]/ []  Follow-up with PCP Additional Notes: Pt has urinary frequency and"ring of fire" pain.  Pt has taken AZO and 800 mg IBU which has provided some relief.  Summary: urinary concerns   The patient has experience urinary concerns for roughly 3 days  The patient shares that they have been previously seen for urinary concerns and are continuing to experience lower back discomfort and experiencing frequent urination  Please contact the patient further when possible     Reason for Disposition  Urinating more frequently than usual (i.e., frequency)  Answer Assessment - Initial Assessment Questions 1. SYMPTOM: "What's the main symptom you're concerned about?" (e.g., frequency, incontinence)     Frequency,and pain 2. ONSET: "When did the  s/s  start?"     06/24/2023 - pt has been having these issues 3. PAIN: "Is there any pain?" If Yes, ask: "How bad is it?" (Scale: 1-10; mild, moderate, severe)     severe 4. CAUSE: "What do you think is causing the symptoms?"     UTI 5. OTHER SYMPTOMS: "Do you have any other symptoms?" (e.g., blood in urine, fever, flank pain, pain with urination)     Frequent urination  Protocols used: Urinary Symptoms-A-AH

## 2023-07-29 NOTE — Patient Instructions (Signed)
Increase water intake to 75 ounces daily

## 2023-07-29 NOTE — Progress Notes (Addendum)
BP 117/82   Pulse 72   Temp (!) 97.5 F (36.4 C) (Oral)   Ht 5' 5.75" (1.67 m)   Wt 136 lb (61.7 kg)   SpO2 98%   BMI 22.12 kg/m    Subjective:    Patient ID: Tammy Harper, female    DOB: Jan 25, 1981, 42 y.o.   MRN: [id number]  HPI: Brayla Rhoad is a 42 y.o. female  Chief Complaint  Patient presents with   urinary symptoms   URINARY SYMPTOMS Symptoms started 3 days ago, started using AZO daily for the past 2 days, admits this has helped a little with her symptoms. She has changed her soaps. She is using unscented tampons, does not associate symptom initiation with menstrual cycle.  Dysuria: yes Urinary frequency: yes Urgency: yes Small volume voids: yes Symptom severity: yes Urinary incontinence: no Foul odor: no Hematuria: no Abdominal pain: no Back pain: no Suprapubic pain/pressure: no Flank pain: no Fever:  no Vomiting: no Relief with cranberry juice: Yes Relief with pyridium: yes AZO Status: worse Previous urinary tract infection: yes Recurrent urinary tract infection: no Sexual activity:monogamous relationship History of sexually transmitted disease: no Treatments attempted: pyridium and cranberry    Relevant past medical, surgical, family and social history reviewed and updated as indicated. Interim medical history since our last visit reviewed. Allergies and medications reviewed and updated.  Review of Systems  Constitutional:  Negative for chills and fever.  Respiratory: Negative.    Cardiovascular: Negative.   Gastrointestinal:  Negative for vomiting.  Genitourinary:  Positive for dysuria, frequency and urgency. Negative for decreased urine volume, difficulty urinating, dyspareunia, flank pain, hematuria and pelvic pain.  Musculoskeletal:  Negative for back pain.    Per HPI unless specifically indicated above     Objective:    BP 117/82   Pulse 72   Temp (!) 97.5 F (36.4 C) (Oral)   Ht 5' 5.75" (1.67 m)   Wt 136 lb (61.7 kg)    SpO2 98%   BMI 22.12 kg/m   Wt Readings from Last 3 Encounters:  07/29/23 136 lb (61.7 kg)  06/24/23 129 lb 12.8 oz (58.9 kg)  11/22/22 148 lb 8 oz (67.4 kg)    Physical Exam Vitals reviewed.  Constitutional:      Appearance: Normal appearance. She is not ill-appearing.  HENT:     Head: Normocephalic and atraumatic.     Right Ear: Tympanic membrane, ear canal and external ear normal.     Left Ear: Tympanic membrane, ear canal and external ear normal.     Nose: Nose normal.     Mouth/Throat:     Mouth: Mucous membranes are moist.  Eyes:     Pupils: Pupils are equal, round, and reactive to light.  Cardiovascular:     Rate and Rhythm: Normal rate and regular rhythm.     Pulses: Normal pulses.          Radial pulses are 2+ on the right side and 2+ on the left side.       Posterior tibial pulses are 2+ on the right side and 2+ on the left side.     Heart sounds: Normal heart sounds.  Pulmonary:     Effort: Pulmonary effort is normal.     Breath sounds: Normal breath sounds.  Abdominal:     General: Bowel sounds are normal.     Palpations: Abdomen is soft.     Tenderness: There is no abdominal tenderness. There is no right  CVA tenderness or left CVA tenderness.  Musculoskeletal:        General: Normal range of motion.     Cervical back: Normal range of motion.  Skin:    General: Skin is warm and dry.  Neurological:     Mental Status: She is alert.  Psychiatric:        Mood and Affect: Mood normal.        Behavior: Behavior normal. Behavior is cooperative.        Thought Content: Thought content normal.     Results for orders placed or performed in visit on 06/24/23  Urine Culture   Specimen: Urine   UR  Result Value Ref Range   Urine Culture, Routine Final report    Organism ID, Bacteria Comment   WET PREP FOR TRICH, YEAST, CLUE   Specimen: Urine   Urine  Result Value Ref Range   Trichomonas Exam Negative Negative   Yeast Exam Negative Negative   Clue Cell  Exam Positive (A) Negative  Microscopic Examination   Urine  Result Value Ref Range   WBC, UA 0-5 0 - 5 /hpf   RBC, Urine 0-2 0 - 2 /hpf   Epithelial Cells (non renal) 0-10 0 - 10 /hpf   Mucus, UA Present (A) Not Estab.   Bacteria, UA None seen None seen/Few  Urinalysis, Routine w reflex microscopic  Result Value Ref Range   Specific Gravity, UA 1.025 1.005 - 1.030   pH, UA 6.0 5.0 - 7.5   Color, UA Yellow Yellow   Appearance Ur Clear Clear   Leukocytes,UA Negative Negative   Protein,UA Negative Negative/Trace   Glucose, UA Negative Negative   Ketones, UA Trace (A) Negative   RBC, UA Trace (A) Negative   Bilirubin, UA Negative Negative   Urobilinogen, Ur 0.2 0.2 - 1.0 mg/dL   Nitrite, UA Negative Negative   Microscopic Examination See below:       Assessment & Plan:   Problem List Items Addressed This Visit     Dysuria - Primary   Relevant Orders   Urinalysis, Routine w reflex microscopic   Urine Culture   WET PREP FOR TRICH, YEAST, CLUE   Bacterial vaginosis    Acute, ongoing. UA negative, will send for culture, wet prep + for BV. Will treat with Flagyl gel nightly for 5 days. Recommend increased water intake to 70 oz daily. Return if symptoms do not improve.         Follow up plan: Return if symptoms worsen or fail to improve.

## 2023-07-31 LAB — URINE CULTURE

## 2023-08-01 ENCOUNTER — Telehealth: Payer: BC Managed Care – PPO | Admitting: Physician Assistant

## 2023-08-01 DIAGNOSIS — N760 Acute vaginitis: Secondary | ICD-10-CM

## 2023-08-01 DIAGNOSIS — B9689 Other specified bacterial agents as the cause of diseases classified elsewhere: Secondary | ICD-10-CM | POA: Diagnosis not present

## 2023-08-01 MED ORDER — CLINDAMYCIN HCL 300 MG PO CAPS: 300.0000 mg | ORAL_CAPSULE | Freq: Three times a day (TID) | ORAL | 0 refills | Status: DC

## 2023-08-01 NOTE — Progress Notes (Signed)
Virtual Visit Consent   Tammy Harper, you are scheduled for a virtual visit with a [facility] provider today. Just as with appointments in the office, your consent must be obtained to participate. Your consent will be active for this visit and any virtual visit you may have with one of our providers in the next 365 days. If you have a MyChart account, a copy of this consent can be sent to you electronically.  As this is a virtual visit, video technology does not allow for your provider to perform a traditional examination. This may limit your provider's ability to fully assess your condition. If your provider identifies any concerns that need to be evaluated in person or the need to arrange testing (such as labs, EKG, etc.), we will make arrangements to do so. Although advances in technology are sophisticated, we cannot ensure that it will always work on either your end or our end. If the connection with a video visit is poor, the visit may have to be switched to a telephone visit. With either a video or telephone visit, we are not always able to ensure that we have a secure connection.  By engaging in this virtual visit, you consent to the provision of healthcare and authorize for your insurance to be billed (if applicable) for the services provided during this visit. Depending on your insurance coverage, you may receive a charge related to this service.  I need to obtain your verbal consent now. Are you willing to proceed with your visit today? [name] has provided verbal consent on 08/01/2023 for a virtual visit (video or telephone). [name], PA-C  Date: 08/01/2023 12:02 PM  Virtual Visit via Video Note   I, [name], connected with  Roylene Mahi Reasoner  ([id number], Jun 01, 1981) on 08/01/23 at 12:00 PM EST by a video-enabled telemedicine application and verified that I am speaking with the correct person using two identifiers.  Location: Patient: Virtual  Visit Location Patient: Home Provider: Virtual Visit Location Provider: Home Office   I discussed the limitations of evaluation and management by telemedicine and the availability of in person appointments. The patient expressed understanding and agreed to proceed.    History of Present Illness: Tammy Harper is a 42 y.o. who identifies as a female who was assigned female at birth, and is being seen today for worsening vaginal symptoms. Seen by PCP on 07/29/23 and tested positive for BV. Started on Metrogel on Monday, but irritation and discomfort has increased. Urine Culture was essentially negative. Had previous episode of BV about a month ago and was treated with Metronidazole orally, symptoms returned fairly quickly, which was why they tried the Metrogel this time.    Problems:  Patient Active Problem List   Diagnosis Date Noted   Bacterial vaginosis 07/29/2023   Dysuria 06/24/2023   RUQ pain 01/02/2022   Depression 02/02/2019    Allergies:  Allergies  Allergen Reactions   Zoloft [Sertraline]     GI Issues   Medications:  Current Outpatient Medications:    buPROPion (WELLBUTRIN XL) 300 MG 24 hr tablet, TAKE 1 TABLET(300 MG) BY MOUTH DAILY, Disp: 90 tablet, Rfl: 0   drospirenone-ethinyl estradiol (OCELLA) 3-0.03 MG tablet, Take 1 tablet by mouth daily., Disp: 84 tablet, Rfl: 3   escitalopram (LEXAPRO) 10 MG tablet, Take 1 tablet (10 mg total) by mouth daily. (Patient not taking: Reported on 06/24/2023), Disp: 90 tablet, Rfl: 1   metroNIDAZOLE (METROGEL) 0.75 % vaginal gel, Place  1 Applicatorful vaginally at bedtime for 5 days., Disp: 50 g, Rfl: 0   nitrofurantoin, macrocrystal-monohydrate, (MACROBID) 100 MG capsule, Take 1 capsule (100 mg total) by mouth 2 (two) times daily., Disp: 10 capsule, Rfl: 0   topiramate (TOPAMAX) 25 MG tablet, Take 1 tablet (25 mg total) by mouth 2 (two) times daily. (Patient not taking: Reported on 06/24/2023), Disp: 60 tablet, Rfl: 0   valACYclovir  (VALTREX) 1000 MG tablet, Take 1 tablet (1,000 mg total) by mouth 2 (two) times daily., Disp: 20 tablet, Rfl: 1  Observations/Objective: Patient is well-developed, well-nourished in no acute distress.  Resting comfortably at home.  Head is normocephalic, atraumatic.  No labored breathing.  Speech is clear and coherent with logical content.  Patient is alert and oriented at baseline.    Assessment and Plan: There are no diagnoses linked to this encounter. - Symptoms consistent with worsening vaginitis, may be having irritation from the vaginal gel itself - Clindamycin prescribed - Limit bubble baths, scented lotions/soaps/detergents - Limit tight fitting clothing - Seek on person evaluation if not improving or if symptoms worsen   Follow Up Instructions: I discussed the assessment and treatment plan with the patient. The patient was provided an opportunity to ask questions and all were answered. The patient agreed with the plan and demonstrated an understanding of the instructions.  A copy of instructions were sent to the patient via MyChart unless otherwise noted below.    The patient was advised to call back or seek an in-person evaluation if the symptoms worsen or if the condition fails to improve as anticipated.    [name], PA-C

## 2023-08-01 NOTE — Patient Instructions (Signed)
Tammy Harper, thank you for joining [name], PA-C for today's virtual visit.  While this provider is not your primary care provider (PCP), if your PCP is located in our provider database this encounter information will be shared with them immediately following your visit.   A [facility] MyChart account gives you access to today's visit and all your visits, tests, and labs performed at [facility] " click here if you don't have a [facility] MyChart account or go to [email or web address]  Consent: (Patient) Tammy Harper provided verbal consent for this virtual visit at the beginning of the encounter.  Current Medications:  Current Outpatient Medications:    clindamycin (CLEOCIN) 300 MG capsule, Take 1 capsule (300 mg total) by mouth 3 (three) times daily., Disp: 21 capsule, Rfl: 0   buPROPion (WELLBUTRIN XL) 300 MG 24 hr tablet, TAKE 1 TABLET(300 MG) BY MOUTH DAILY, Disp: 90 tablet, Rfl: 0   drospirenone-ethinyl estradiol (OCELLA) 3-0.03 MG tablet, Take 1 tablet by mouth daily., Disp: 84 tablet, Rfl: 3   escitalopram (LEXAPRO) 10 MG tablet, Take 1 tablet (10 mg total) by mouth daily. (Patient not taking: Reported on 06/24/2023), Disp: 90 tablet, Rfl: 1   metroNIDAZOLE (METROGEL) 0.75 % vaginal gel, Place 1 Applicatorful vaginally at bedtime for 5 days., Disp: 50 g, Rfl: 0   nitrofurantoin, macrocrystal-monohydrate, (MACROBID) 100 MG capsule, Take 1 capsule (100 mg total) by mouth 2 (two) times daily., Disp: 10 capsule, Rfl: 0   topiramate (TOPAMAX) 25 MG tablet, Take 1 tablet (25 mg total) by mouth 2 (two) times daily. (Patient not taking: Reported on 06/24/2023), Disp: 60 tablet, Rfl: 0   valACYclovir (VALTREX) 1000 MG tablet, Take 1 tablet (1,000 mg total) by mouth 2 (two) times daily., Disp: 20 tablet, Rfl: 1   Medications ordered in this encounter:  Meds ordered this encounter  Medications   clindamycin (CLEOCIN) 300 MG capsule    Sig: Take 1  capsule (300 mg total) by mouth 3 (three) times daily.    Dispense:  21 capsule    Refill:  0    Order Specific Question:   Supervising Provider    Answer:   Merrilee Jansky [id number]     *If you need refills on other medications prior to your next appointment, please contact your pharmacy*  Follow-Up: Call back or seek an in-person evaluation if the symptoms worsen or if the condition fails to improve as anticipated.  [facility] [phone]  Other Instructions Vaginal Probiotics: AZO vaginal probiotic OLLY Happy Hoo-Ha RAW Vaginal Care RenewLife Women's vaginal probiotic RepHresh Pro-B  Vaginal washes: Honey Pot Summer's Eve Vagisil Feminine cleanser   Healthy vaginal hygiene practices    -  Avoid sleeper pajamas. Nightgowns allow air to circulate.  Sleep without underpants whenever possible.   -  Wear cotton underpants during the day. Double-rinse underwear after washing to avoid residual irritants. Do not use fabric softeners for underwear and swimsuits.   - Avoid tights, leotards, leggings, "skinny" jeans, and other tight-fitting clothing. Skirts and loose-fitting pants allow air to circulate.   - Avoid pantyliners.  Instead use tampons or cotton pads.   - Use the restroom after intercourse to help prevent UTI's   - Daily warm bathing is helpful:     - Soak in clean water (no soap) for 10 to 15 minutes. Adding vinegar or baking soda to the water has not been specifically studied and may not be better than clean water alone.      -  Use soap to wash regions other than the genital area just before getting out of the tub. Limit use of any soap on genital areas. Use fragance-free soaps.     - Rinse the genital area well and gently pat dry.  Don't rub.  Hair dryer to assist with drying can be used only if on cool setting.     - Do not use bubble baths or perfumed soaps.   - Do not use any feminine sprays, douches or powders.  These contain chemicals that  will irritate the skin.   - If the genital area is tender or swollen, cool compresses may relieve the discomfort. Unscented wet wipes can be used instead of toilet paper for wiping.    - Emollients, such as Vaseline, may help protect skin and can be applied to the irritated area.   - Always remember to wipe front-to-back after bowel movements. Pat dry after urination.   - Do not sit in wet swimsuits for [place] periods of time after swimming    If you have been instructed to have an in-person evaluation today at a local Urgent Care facility, please use the link below. It will take you to a list of all of our available [facility], including address, phone number and hours of operation. Please do not delay care.  [facility]  If you or a family member do not have a primary care provider, use the link below to schedule a visit and establish care. When you choose a [facility] primary care physician or advanced practice provider, you gain a [facility]-term partner in health. Find a Primary Care Provider  Learn more about [facility] in-office and virtual care options:  - Get Care Now

## 2023-08-05 ENCOUNTER — Ambulatory Visit: Payer: Self-pay

## 2023-08-05 NOTE — Telephone Encounter (Signed)
I'm not able to determine the cause of the rash.  I recommend stopping the medication.

## 2023-08-05 NOTE — Telephone Encounter (Signed)
Unable to reach patient, voicemail is full. Ok for [facility] to review comment from provider if patient returns call.

## 2023-08-05 NOTE — Telephone Encounter (Signed)
FYI to provider

## 2023-08-05 NOTE — Telephone Encounter (Signed)
Chief Complaint: Urine frequency, vaginal itching Symptoms: Rash under eyes, states it may be eczema or a reaction to clindamycin that was started on 11/28. Frequency: 10 days Pertinent Negatives: Patient denies discharge, other symptoms Disposition: [] ED /[] Urgent Care (no appt availability in office) / [x] Appointment(In office/virtual)/ []  [facility]/ [] Home Care/ [] Refused Recommended Disposition /[] [facility]/ []  Follow-up with PCP Additional Notes: Patient advised another in person visit, she asks for early Wednesday first thing. No availability, advised earliest is Wednesday at 1500, she agreed to that appointment. She says she may be having a reaction to the clindamycin, has a rash under both eyes, but says she also has eczema. Unsure if that's what it is or the medication. Will route to the provider for review.    Summary: BV symptoms continuing after medication change   Pt was seen last Monday and diagnosed with BV. Patient was given cream which caused her irritation. Pt had a virtual visit last Thursday 08/31/2023 and was prescribed clindamycin. Pt states she is not feeling any better. Pt still has itching and burning and has to constantly use the bathroom. Pt denies any odor and discharge. Pt unable to come into office due to work.  Pt seeking clinical advice.     Reason for Disposition  [1] Caller has NON-URGENT question (includes prescribed medication questions) AND [2] triager unable to answer  Answer Assessment - Initial Assessment Questions 1. MAIN CONCERN OR SYMPTOM:  "What is your main concern right now?" "What question do you have?" "What's the main symptom you're worried about?" (e.g., breathing difficulty, cough, fever. pain)     Urine frequency, vulva itching 2. ONSET: "When did the symptoms start?"     10 days ago 3. BETTER-SAME-WORSE: "Are you getting better, staying the same, or getting worse compared to how you felt at your last visit to the  doctor (most recent medical visit)?"     Same, just uncomfortable 4. VISIT DATE: "When were you seen?" (Date)     07/29/23 and 08/01/23 (virtual) 5. VISIT DOCTOR: "What is the name of the doctor taking care of you now?"     Raynee Ida on 08/01/23 6. VISIT DIAGNOSIS:  "What was the main symptom or problem that you were seen for?" "Were you given a diagnosis?"      BV 7. VISIT MEDICINES: "Did the doctor order any new medicines for you to use?" If Yes, ask: "Have you filled the prescription and started taking the medicine?"      Clindamycin 8. NEXT APPOINTMENT: "Have you scheduled a follow-up appointment with your doctor?"     No 9. PAIN: "Is there any pain?" If Yes, ask: "How bad is it?"  (Scale 0-10; or mild, moderate, severe)    - NONE (0): no pain    - MILD (1-3): doesn't interfere with normal activities     - MODERATE (4-7): interferes with normal activities or awakens from sleep     - SEVERE (8-10): excruciating pain, unable to do any normal activities     No 10. FEVER: "Do you have a fever?" If Yes, ask: "What is it, how was it measured  and when did it start?"       No 11. OTHER SYMPTOMS: "Do you have any other symptoms?"       Rash under eyes  Protocols used: Recent Medical Visit for Illness Follow-up Call-A-AH

## 2023-08-05 NOTE — Telephone Encounter (Signed)
Patient called and given message from provider to stop taking clindamycin. She says she doesn't think that's causing the rash because she had it before taking the clindamycin, but she will stop as advised.

## 2023-08-06 NOTE — Progress Notes (Signed)
Hi Tammy Harper, your urine culture has returned as negative. Thank you for allowing me to participate in your care.

## 2023-08-07 ENCOUNTER — Ambulatory Visit: Payer: BC Managed Care – PPO | Admitting: Nurse Practitioner

## 2023-08-07 ENCOUNTER — Encounter: Payer: Self-pay | Admitting: Nurse Practitioner

## 2023-08-07 VITALS — BP 124/84 | HR 73 | Temp 97.7°F | Ht 64.75 in | Wt 132.6 lb

## 2023-08-07 DIAGNOSIS — B9689 Other specified bacterial agents as the cause of diseases classified elsewhere: Secondary | ICD-10-CM | POA: Diagnosis not present

## 2023-08-07 DIAGNOSIS — R350 Frequency of micturition: Secondary | ICD-10-CM

## 2023-08-07 DIAGNOSIS — N760 Acute vaginitis: Secondary | ICD-10-CM | POA: Diagnosis not present

## 2023-08-07 DIAGNOSIS — R8290 Unspecified abnormal findings in urine: Secondary | ICD-10-CM | POA: Diagnosis not present

## 2023-08-07 LAB — WET PREP FOR TRICH, YEAST, CLUE
Clue Cell Exam: POSITIVE — AB
Trichomonas Exam: NEGATIVE
Yeast Exam: NEGATIVE

## 2023-08-07 LAB — URINALYSIS, ROUTINE W REFLEX MICROSCOPIC
Bilirubin, UA: NEGATIVE
Glucose, UA: NEGATIVE
Ketones, UA: NEGATIVE
Nitrite, UA: NEGATIVE
Specific Gravity, UA: >1.03 — ABNORMAL HIGH (ref 1.005–1.030)
Urobilinogen, Ur: 0.2 mg/dL (ref 0.2–1.0)
pH, UA: 5.5 (ref 5.0–7.5)

## 2023-08-07 LAB — MICROSCOPIC EXAMINATION: Bacteria, UA: NONE SEEN

## 2023-08-07 MED ORDER — METRONIDAZOLE 500 MG PO TABS: 500.0000 mg | ORAL_TABLET | Freq: Two times a day (BID) | ORAL | 0 refills | Status: AC

## 2023-08-07 NOTE — Progress Notes (Signed)
BP 124/84 (BP Location: Left Arm, Patient Position: Sitting, Cuff Size: Normal)   Pulse 73   Temp 97.7 F (36.5 C) (Oral)   Ht 5' 4.75" (1.645 m)   Wt 132 lb 9.6 oz (60.1 kg)   SpO2 98%   BMI 22.24 kg/m    Subjective:    Patient ID: Tammy Harper, female    DOB: [birth year], 42 y.o.   MRN: [id number]  HPI: Tammy Harper is a 42 y.o. female  Chief Complaint  Patient presents with   urine frequency    Medication before ineffective, symptoms are frequent urination, discomfort at the front of vagina, feels like a UTI but was told was not   URINARY SYMPTOMS Patient states she is having pain right at the tip of her urethra.  Having burning and itching in the vaginal.  She was treated for BV on 11/28. Denies any discharge or a smell.  If anything, she feels more dry.   Relevant past medical, surgical, family and social history reviewed and updated as indicated. Interim medical history since our last visit reviewed. Allergies and medications reviewed and updated.  Review of Systems  Genitourinary:  Positive for dysuria, frequency and urgency. Negative for vaginal discharge.    Per HPI unless specifically indicated above     Objective:    BP 124/84 (BP Location: Left Arm, Patient Position: Sitting, Cuff Size: Normal)   Pulse 73   Temp 97.7 F (36.5 C) (Oral)   Ht 5' 4.75" (1.645 m)   Wt 132 lb 9.6 oz (60.1 kg)   SpO2 98%   BMI 22.24 kg/m   Wt Readings from Last 3 Encounters:  08/07/23 132 lb 9.6 oz (60.1 kg)  07/29/23 136 lb (61.7 kg)  06/24/23 129 lb 12.8 oz (58.9 kg)    Physical Exam Vitals and nursing note reviewed.  Constitutional:      General: She is not in acute distress.    Appearance: Normal appearance. She is normal weight. She is not ill-appearing, toxic-appearing or diaphoretic.  HENT:     Head: Normocephalic.     Right Ear: External ear normal.     Left Ear: External ear normal.     Nose: Nose normal.     Mouth/Throat:     Mouth: Mucous  membranes are moist.     Pharynx: Oropharynx is clear.  Eyes:     General:        Right eye: No discharge.        Left eye: No discharge.     Extraocular Movements: Extraocular movements intact.     Conjunctiva/sclera: Conjunctivae normal.     Pupils: Pupils are equal, round, and reactive to light.  Cardiovascular:     Rate and Rhythm: Normal rate and regular rhythm.     Heart sounds: No murmur heard. Pulmonary:     Effort: Pulmonary effort is normal. No respiratory distress.     Breath sounds: Normal breath sounds. No wheezing or rales.  Abdominal:     General: Abdomen is flat. Bowel sounds are normal. There is no distension.     Palpations: Abdomen is soft.     Tenderness: There is no abdominal tenderness. There is no right CVA tenderness, left CVA tenderness or guarding.  Musculoskeletal:     Cervical back: Normal range of motion and neck supple.  Skin:    General: Skin is warm and dry.     Capillary Refill: Capillary refill takes less than 2 seconds.  Neurological:  General: No focal deficit present.     Mental Status: She is alert and oriented to person, place, and time. Mental status is at baseline.  Psychiatric:        Mood and Affect: Mood normal.        Behavior: Behavior normal.        Thought Content: Thought content normal.        Judgment: Judgment normal.     Results for orders placed or performed in visit on 07/29/23  Urine Culture   Specimen: Urine   UR  Result Value Ref Range   Urine Culture, Routine Final report    Organism ID, Bacteria Comment   WET PREP FOR TRICH, YEAST, CLUE   Specimen: Urine   Urine  Result Value Ref Range   Trichomonas Exam Negative Negative   Yeast Exam Negative Negative   Clue Cell Exam Positive (A) Negative  Microscopic Examination   Urine  Result Value Ref Range   WBC, UA 0-5 0 - 5 /hpf   RBC, Urine 0-2 0 - 2 /hpf   Epithelial Cells (non renal) 0-10 0 - 10 /hpf   Mucus, UA Present (A) Not Estab.   Bacteria, UA None  seen None seen/Few  Urinalysis, Routine w reflex microscopic  Result Value Ref Range   Specific Gravity, UA 1.010 1.005 - 1.030   pH, UA CANCELED    Color, UA Orange Yellow   Appearance Ur Clear Clear   Protein,UA CANCELED    Glucose, UA CANCELED    Ketones, UA CANCELED    Microscopic Examination See below:       Assessment & Plan:   Problem List Items Addressed This Visit       Genitourinary   Bacterial vaginosis - Primary    Ongoing problem.  Swab still positive for BV.  Will treat with Flagyl BID for 14 days. Increase water intake. Complete course of medication.  Follow up if not improved.      Relevant Medications   metroNIDAZOLE (FLAGYL) 500 MG tablet   Other Visit Diagnoses     Urinary frequency       Relevant Orders   Urinalysis, Routine w reflex microscopic   WET PREP FOR TRICH, YEAST, CLUE        Follow up plan: No follow-ups on file.

## 2023-08-07 NOTE — Assessment & Plan Note (Signed)
Ongoing problem.  Swab still positive for BV.  Will treat with Flagyl BID for 14 days. Increase water intake. Complete course of medication.  Follow up if not improved.

## 2023-08-09 LAB — URINE CULTURE

## 2023-08-22 ENCOUNTER — Encounter: Payer: Self-pay | Admitting: Nurse Practitioner

## 2023-08-22 NOTE — Progress Notes (Signed)
There were no vitals taken for this visit.   Subjective:    Patient ID: Tammy Harper, female    DOB: 1981-08-08, 42 y.o.   MRN: [id number]  HPI: Tammy Harper is a 42 y.o. female  Chief Complaint  Patient presents with   Depression   DEPRESSION Patient states she has had a mood change.  Not wanting to do anything, she is crying all the time.  She is very overwhelmed.  She had a panic attack for the first time in a [place] time. Had weaned herself off the Lexapro and felt like she ws doing well until recently when symptos started worsening.  Mood status: uncontrolled Satisfied with current treatment?: yes Symptom severity: severe  Duration of current treatment : years Side effects: no Medication compliance: excellent compliance Flowsheet Row Video Visit from 08/23/2023 in [facility]  PHQ-9 Total Score 14         08/23/2023    8:34 AM 08/07/2023    3:10 PM 07/29/2023    8:42 AM 06/24/2023    8:48 AM  GAD 7 : Generalized Anxiety Score  Nervous, Anxious, on Edge 3 1 1 1   Control/stop worrying 3 1 1 2   Worry too much - different things 3 1 1 2   Trouble relaxing 2 0 0 1  Restless 0 0 0 0  Easily annoyed or irritable 2 0 0 0  Afraid - awful might happen 0 0 0 0  Total GAD 7 Score 13 3 3 6   Anxiety Difficulty   Not difficult at all Not difficult at all     Relevant past medical, surgical, family and social history reviewed and updated as indicated. Interim medical history since our last visit reviewed. Allergies and medications reviewed and updated.  Review of Systems  Psychiatric/Behavioral:  Positive for dysphoric mood. Negative for suicidal ideas. The patient is nervous/anxious.     Per HPI unless specifically indicated above     Objective:    There were no vitals taken for this visit.  Wt Readings from Last 3 Encounters:  08/07/23 132 lb 9.6 oz (60.1 kg)  07/29/23 136 lb (61.7 kg)  06/24/23 129 lb 12.8 oz (58.9 kg)     Physical Exam Vitals and nursing note reviewed.  HENT:     Head: Normocephalic.     Right Ear: Hearing normal.     Left Ear: Hearing normal.     Nose: Nose normal.  Eyes:     Pupils: Pupils are equal, round, and reactive to light.  Pulmonary:     Effort: Pulmonary effort is normal. No respiratory distress.  Neurological:     Mental Status: She is alert.  Psychiatric:        Mood and Affect: Mood normal.        Behavior: Behavior normal.        Thought Content: Thought content normal.        Judgment: Judgment normal.     Results for orders placed or performed in visit on 08/07/23  WET PREP FOR TRICH, YEAST, CLUE   Collection Time: 08/07/23  3:17 PM   Specimen: Urine   Urine  Result Value Ref Range   Trichomonas Exam Negative Negative   Yeast Exam Negative Negative   Clue Cell Exam Positive (A) Negative  Microscopic Examination   Collection Time: 08/07/23  3:17 PM   Urine  Result Value Ref Range   WBC, UA 6-10 (A) 0 - 5 /hpf  RBC, Urine 0-2 0 - 2 /hpf   Epithelial Cells (non renal) 0-10 0 - 10 /hpf   Mucus, UA Present (A) Not Estab.   Bacteria, UA None seen None seen/Few  Urinalysis, Routine w reflex microscopic   Collection Time: 08/07/23  3:17 PM  Result Value Ref Range   Specific Gravity, UA >1.030 (H) 1.005 - 1.030   pH, UA 5.5 5.0 - 7.5   Color, UA Yellow Yellow   Appearance Ur Cloudy (A) Clear   Leukocytes,UA 1+ (A) Negative   Protein,UA Trace (A) Negative/Trace   Glucose, UA Negative Negative   Ketones, UA Negative Negative   RBC, UA 1+ (A) Negative   Bilirubin, UA Negative Negative   Urobilinogen, Ur 0.2 0.2 - 1.0 mg/dL   Nitrite, UA Negative Negative   Microscopic Examination See below:   Urine Culture   Collection Time: 08/07/23  3:35 PM   Specimen: Urine   UR  Result Value Ref Range   Urine Culture, Routine Final report    Organism ID, Bacteria Comment       Assessment & Plan:   Problem List Items Addressed This Visit       Other    Depression - Primary   Chronic.  Not well controlled.  Currently on Wellbutrin 300mg  daily.  Had previously been on lexparo 10mg  but weaned herself off.  Will restart Lexapro 10mg  daily.  Follow up in 2 weeks. Call sooner if concerns arise.       Relevant Medications   escitalopram (LEXAPRO) 10 MG tablet     Follow up plan: Return in about 2 weeks (around 09/06/2023) for Depression/Anxiety FU.   This visit was completed via MyChart due to the restrictions of the COVID-19 pandemic. All issues as above were discussed and addressed. Physical exam was done as above through visual confirmation on MyChart. If it was felt that the patient should be evaluated in the office, they were directed there. The patient verbally consented to this visit. Location of the patient: Home Location of the provider: Office Those involved with this call:  Provider: Larae Grooms, NP CMA: NA Front Desk/Registration: Servando Snare This encounter was conducted via Video.  I spent 30 dedicated to the care of this patient on the date of this encounter to include previsit review of symptoms, plan of care and follow up, face to face time with the patient, and post visit ordering of testing.

## 2023-08-23 ENCOUNTER — Telehealth: Payer: BC Managed Care – PPO | Admitting: Nurse Practitioner

## 2023-08-23 ENCOUNTER — Encounter: Payer: Self-pay | Admitting: Nurse Practitioner

## 2023-08-23 DIAGNOSIS — F32A Depression, unspecified: Secondary | ICD-10-CM

## 2023-08-23 DIAGNOSIS — F330 Major depressive disorder, recurrent, mild: Secondary | ICD-10-CM

## 2023-08-23 MED ORDER — ESCITALOPRAM OXALATE 10 MG PO TABS: 10.0000 mg | ORAL_TABLET | Freq: Every day | ORAL | Status: DC

## 2023-08-23 NOTE — Assessment & Plan Note (Signed)
Chronic.  Not well controlled.  Currently on Wellbutrin 300mg  daily.  Had previously been on lexparo 10mg  but weaned herself off.  Will restart Lexapro 10mg  daily.  Follow up in 2 weeks. Call sooner if concerns arise.

## 2023-08-23 NOTE — Telephone Encounter (Signed)
Pt had an appointment today via MyChart Video Visit.

## 2023-09-10 ENCOUNTER — Encounter: Payer: Self-pay | Admitting: Nurse Practitioner

## 2023-09-11 ENCOUNTER — Encounter: Payer: Self-pay | Admitting: Nurse Practitioner

## 2023-09-11 ENCOUNTER — Telehealth: Payer: BC Managed Care – PPO | Admitting: Nurse Practitioner

## 2023-09-11 VITALS — Ht 64.0 in | Wt 132.0 lb

## 2023-09-11 DIAGNOSIS — F32A Depression, unspecified: Secondary | ICD-10-CM | POA: Diagnosis not present

## 2023-09-11 DIAGNOSIS — F330 Major depressive disorder, recurrent, mild: Secondary | ICD-10-CM

## 2023-09-11 MED ORDER — CITALOPRAM HYDROBROMIDE 20 MG PO TABS: 20.0000 mg | ORAL_TABLET | Freq: Every day | ORAL | 0 refills | Status: DC

## 2023-09-11 NOTE — Assessment & Plan Note (Signed)
 Chronic.  Not well controlled.  Currently on Wellbutrin 300mg  daily.  Did not see improvement in symptoms with Lexapro.  Will change to Celexa 20mg .  Follow up in 2 weeks. Call sooner if concerns arise.

## 2023-09-11 NOTE — Progress Notes (Signed)
 Ht 5' 4 (1.626 m)   Wt 132 lb (59.9 kg)   BMI 22.66 kg/m    Subjective:    Patient ID: Tammy Harper, female    DOB: 03/25/1981, 43 y.o.   MRN: [id number]  HPI: Tammy Harper is a 43 y.o. female  Chief Complaint  Patient presents with   Depression   DEPRESSION Patient states she hasn't seen much of an improvement in her mood since restarting the Lexapro .   Mood status: uncontrolled Satisfied with current treatment?: yes Symptom severity: severe  Duration of current treatment : years Side effects: no Medication compliance: excellent compliance Flowsheet Row Video Visit from 08/23/2023 in [facility]  PHQ-9 Total Score 14         08/23/2023    8:34 AM 08/07/2023    3:10 PM 07/29/2023    8:42 AM 06/24/2023    8:48 AM  GAD 7 : Generalized Anxiety Score  Nervous, Anxious, on Edge 3 1 1 1   Control/stop worrying 3 1 1 2   Worry too much - different things 3 1 1 2   Trouble relaxing 2 0 0 1  Restless 0 0 0 0  Easily annoyed or irritable 2 0 0 0  Afraid - awful might happen 0 0 0 0  Total GAD 7 Score 13 3 3 6   Anxiety Difficulty   Not difficult at all Not difficult at all     Relevant past medical, surgical, family and social history reviewed and updated as indicated. Interim medical history since our last visit reviewed. Allergies and medications reviewed and updated.  Review of Systems  Psychiatric/Behavioral:  Positive for dysphoric mood. Negative for suicidal ideas. The patient is nervous/anxious.     Per HPI unless specifically indicated above     Objective:    Ht 5' 4 (1.626 m)   Wt 132 lb (59.9 kg)   BMI 22.66 kg/m   Wt Readings from Last 3 Encounters:  09/11/23 132 lb (59.9 kg)  08/07/23 132 lb 9.6 oz (60.1 kg)  07/29/23 136 lb (61.7 kg)    Physical Exam Vitals and nursing note reviewed.  HENT:     Head: Normocephalic.     Right Ear: Hearing normal.     Left Ear: Hearing normal.     Nose: Nose normal.   Eyes:     Pupils: Pupils are equal, round, and reactive to light.  Pulmonary:     Effort: Pulmonary effort is normal. No respiratory distress.  Neurological:     Mental Status: She is alert.  Psychiatric:        Mood and Affect: Mood normal.        Behavior: Behavior normal.        Thought Content: Thought content normal.        Judgment: Judgment normal.     Results for orders placed or performed in visit on 08/07/23  WET PREP FOR TRICH, YEAST, CLUE   Collection Time: 08/07/23  3:17 PM   Specimen: Urine   Urine  Result Value Ref Range   Trichomonas Exam Negative Negative   Yeast Exam Negative Negative   Clue Cell Exam Positive (A) Negative  Microscopic Examination   Collection Time: 08/07/23  3:17 PM   Urine  Result Value Ref Range   WBC, UA 6-10 (A) 0 - 5 /hpf   RBC, Urine 0-2 0 - 2 /hpf   Epithelial Cells (non renal) 0-10 0 - 10 /hpf   Mucus, UA  Present (A) Not Estab.   Bacteria, UA None seen None seen/Few  Urinalysis, Routine w reflex microscopic   Collection Time: 08/07/23  3:17 PM  Result Value Ref Range   Specific Gravity, UA >1.030 (H) 1.005 - 1.030   pH, UA 5.5 5.0 - 7.5   Color, UA Yellow Yellow   Appearance Ur Cloudy (A) Clear   Leukocytes,UA 1+ (A) Negative   Protein,UA Trace (A) Negative/Trace   Glucose, UA Negative Negative   Ketones, UA Negative Negative   RBC, UA 1+ (A) Negative   Bilirubin, UA Negative Negative   Urobilinogen, Ur 0.2 0.2 - 1.0 mg/dL   Nitrite, UA Negative Negative   Microscopic Examination See below:   Urine Culture   Collection Time: 08/07/23  3:35 PM   Specimen: Urine   UR  Result Value Ref Range   Urine Culture, Routine Final report    Organism ID, Bacteria Comment       Assessment & Plan:   Problem List Items Addressed This Visit       Other   Depression - Primary   Chronic.  Not well controlled.  Currently on Wellbutrin  300mg  daily.  Did not see improvement in symptoms with Lexapro .  Will change to Celexa  20mg .   Follow up in 2 weeks. Call sooner if concerns arise.       Relevant Medications   citalopram  (CELEXA ) 20 MG tablet      Follow up plan: Return in about 2 weeks (around 09/25/2023) for Depression/Anxiety FU (virtual).   This visit was completed via MyChart due to the restrictions of the COVID-19 pandemic. All issues as above were discussed and addressed. Physical exam was done as above through visual confirmation on MyChart. If it was felt that the patient should be evaluated in the office, they were directed there. The patient verbally consented to this visit. Location of the patient: Home Location of the provider: Office Those involved with this call:  Provider: Darice Petty, NP CMA: NA Front Desk/Registration: Claretta Maiden This encounter was conducted via Video.  I spent 30 dedicated to the care of this patient on the date of this encounter to include previsit review of symptoms, plan of care and follow up, face to face time with the patient, and post visit ordering of testing.

## 2023-09-11 NOTE — Progress Notes (Signed)
 Appointment has been made and reminder has been mailed to patient

## 2023-09-13 ENCOUNTER — Telehealth: Payer: BC Managed Care – PPO | Admitting: Nurse Practitioner

## 2023-09-27 ENCOUNTER — Telehealth: Payer: BC Managed Care – PPO | Admitting: Nurse Practitioner

## 2023-09-27 NOTE — Progress Notes (Deleted)
There were no vitals taken for this visit.   Subjective:    Patient ID: Tammy Harper, female    DOB: Nov 09, 1980, 43 y.o.   MRN: [id number]  HPI: Tammy Harper is a 43 y.o. female  No chief complaint on file.  DEPRESSION Patient states she hasn't seen much of an improvement in her mood since restarting the Lexapro.   Mood status: uncontrolled Satisfied with current treatment?: yes Symptom severity: severe  Duration of current treatment : years Side effects: no Medication compliance: excellent compliance Flowsheet Row Video Visit from 08/23/2023 in [facility]  PHQ-9 Total Score 14         08/23/2023    8:34 AM 08/07/2023    3:10 PM 07/29/2023    8:42 AM 06/24/2023    8:48 AM  GAD 7 : Generalized Anxiety Score  Nervous, Anxious, on Edge 3 1 1 1   Control/stop worrying 3 1 1 2   Worry too much - different things 3 1 1 2   Trouble relaxing 2 0 0 1  Restless 0 0 0 0  Easily annoyed or irritable 2 0 0 0  Afraid - awful might happen 0 0 0 0  Total GAD 7 Score 13 3 3 6   Anxiety Difficulty   Not difficult at all Not difficult at all     Relevant past medical, surgical, family and social history reviewed and updated as indicated. Interim medical history since our last visit reviewed. Allergies and medications reviewed and updated.  Review of Systems  Psychiatric/Behavioral:  Positive for dysphoric mood. Negative for suicidal ideas. The patient is nervous/anxious.     Per HPI unless specifically indicated above     Objective:    There were no vitals taken for this visit.  Wt Readings from Last 3 Encounters:  09/11/23 132 lb (59.9 kg)  08/07/23 132 lb 9.6 oz (60.1 kg)  07/29/23 136 lb (61.7 kg)    Physical Exam Vitals and nursing note reviewed.  HENT:     Head: Normocephalic.     Right Ear: Hearing normal.     Left Ear: Hearing normal.     Nose: Nose normal.  Eyes:     Pupils: Pupils are equal, round, and reactive to light.   Pulmonary:     Effort: Pulmonary effort is normal. No respiratory distress.  Neurological:     Mental Status: She is alert.  Psychiatric:        Mood and Affect: Mood normal.        Behavior: Behavior normal.        Thought Content: Thought content normal.        Judgment: Judgment normal.    Results for orders placed or performed in visit on 08/07/23  WET PREP FOR TRICH, YEAST, CLUE   Collection Time: 08/07/23  3:17 PM   Specimen: Urine   Urine  Result Value Ref Range   Trichomonas Exam Negative Negative   Yeast Exam Negative Negative   Clue Cell Exam Positive (A) Negative  Microscopic Examination   Collection Time: 08/07/23  3:17 PM   Urine  Result Value Ref Range   WBC, UA 6-10 (A) 0 - 5 /hpf   RBC, Urine 0-2 0 - 2 /hpf   Epithelial Cells (non renal) 0-10 0 - 10 /hpf   Mucus, UA Present (A) Not Estab.   Bacteria, UA None seen None seen/Few  Urinalysis, Routine w reflex microscopic   Collection Time: 08/07/23  3:17 PM  Result Value Ref Range   Specific Gravity, UA >1.030 (H) 1.005 - 1.030   pH, UA 5.5 5.0 - 7.5   Color, UA Yellow Yellow   Appearance Ur Cloudy (A) Clear   Leukocytes,UA 1+ (A) Negative   Protein,UA Trace (A) Negative/Trace   Glucose, UA Negative Negative   Ketones, UA Negative Negative   RBC, UA 1+ (A) Negative   Bilirubin, UA Negative Negative   Urobilinogen, Ur 0.2 0.2 - 1.0 mg/dL   Nitrite, UA Negative Negative   Microscopic Examination See below:   Urine Culture   Collection Time: 08/07/23  3:35 PM   Specimen: Urine   UR  Result Value Ref Range   Urine Culture, Routine Final report    Organism ID, Bacteria Comment       Assessment & Plan:   Problem List Items Addressed This Visit   None      Follow up plan: No follow-ups on file.   This visit was completed via MyChart due to the restrictions of the COVID-19 pandemic. All issues as above were discussed and addressed. Physical exam was done as above through visual confirmation on  MyChart. If it was felt that the patient should be evaluated in the office, they were directed there. The patient verbally consented to this visit. Location of the patient: Home Location of the provider: Office Those involved with this call:  Provider: Larae Grooms, NP CMA: NA Front Desk/Registration: Servando Snare This encounter was conducted via Video.  I spent 30 dedicated to the care of this patient on the date of this encounter to include previsit review of symptoms, plan of care and follow up, face to face time with the patient, and post visit ordering of testing.

## 2023-10-14 ENCOUNTER — Telehealth: Payer: BC Managed Care – PPO | Admitting: Nurse Practitioner

## 2023-11-03 ENCOUNTER — Other Ambulatory Visit: Payer: Self-pay | Admitting: Nurse Practitioner

## 2023-11-04 ENCOUNTER — Encounter: Payer: Self-pay | Admitting: Nurse Practitioner

## 2023-11-04 ENCOUNTER — Other Ambulatory Visit: Payer: Self-pay

## 2023-11-04 MED ORDER — DROSPIRENONE-ETHINYL ESTRADIOL 3-0.03 MG PO TABS: 1.0000 | ORAL_TABLET | Freq: Every day | ORAL | 3 refills | Status: DC

## 2024-02-05 ENCOUNTER — Other Ambulatory Visit: Payer: Self-pay | Admitting: Nurse Practitioner

## 2024-02-06 NOTE — Telephone Encounter (Signed)
 OFFICE VISIT NEEDED FOR ADDITIONAL REFILLS   Requested Prescriptions  Pending Prescriptions Disp Refills   citalopram  (CELEXA ) 20 MG tablet [Pharmacy Med Name: CITALOPRAM  20MG  TABLETS] 90 tablet 0    Sig: TAKE 1 TABLET(20 MG) BY MOUTH DAILY     Psychiatry:  Antidepressants - SSRI Failed - 02/06/2024  2:51 PM      Failed - Valid encounter within last 6 months    Recent Outpatient Visits   None            Passed - Completed PHQ-2 or PHQ-9 in the last 360 days

## 2024-02-10 ENCOUNTER — Encounter: Payer: Self-pay | Admitting: Nurse Practitioner

## 2024-02-11 ENCOUNTER — Telehealth (INDEPENDENT_AMBULATORY_CARE_PROVIDER_SITE_OTHER): Admitting: Nurse Practitioner

## 2024-02-11 ENCOUNTER — Encounter: Payer: Self-pay | Admitting: Nurse Practitioner

## 2024-02-11 DIAGNOSIS — F330 Major depressive disorder, recurrent, mild: Secondary | ICD-10-CM | POA: Diagnosis not present

## 2024-02-11 MED ORDER — CITALOPRAM HYDROBROMIDE 20 MG PO TABS: 20.0000 mg | ORAL_TABLET | Freq: Every day | ORAL | 0 refills | Status: DC

## 2024-02-11 NOTE — Progress Notes (Unsigned)
 There were no vitals taken for this visit.   Subjective:    Patient ID: Tammy Harper, female    DOB: [birth year], 43 y.o.   MRN: [id number]  HPI: Tammy Harper is a 43 y.o. female  Chief Complaint  Patient presents with   Medical Management of Chronic Issues   DEPRESSION Patient states she has been out of the citalopram  for about two weeks.  States she feels like she has been more emotional and feels like that is the reason.  She is no longer on the Citalopram .   Mood status: improved. Satisfied with current treatment?: yes Symptom severity: severe  Duration of current treatment : years Side effects: no Medication compliance: excellent compliance   Relevant past medical, surgical, family and social history reviewed and updated as indicated. Interim medical history since our last visit reviewed. Allergies and medications reviewed and updated.  Review of Systems  Psychiatric/Behavioral:  Positive for dysphoric mood. Negative for suicidal ideas. The patient is nervous/anxious.     Per HPI unless specifically indicated above     Objective:    There were no vitals taken for this visit.  Wt Readings from Last 3 Encounters:  09/11/23 132 lb (59.9 kg)  08/07/23 132 lb 9.6 oz (60.1 kg)  07/29/23 136 lb (61.7 kg)    Physical Exam Vitals and nursing note reviewed.  HENT:     Head: Normocephalic.     Right Ear: Hearing normal.     Left Ear: Hearing normal.     Nose: Nose normal.  Eyes:     Pupils: Pupils are equal, round, and reactive to light.  Pulmonary:     Effort: Pulmonary effort is normal. No respiratory distress.  Neurological:     Mental Status: She is alert.  Psychiatric:        Mood and Affect: Mood normal.        Behavior: Behavior normal.        Thought Content: Thought content normal.        Judgment: Judgment normal.     Results for orders placed or performed in visit on 08/07/23  WET PREP FOR TRICH, YEAST, CLUE   Collection Time:  08/07/23  3:17 PM   Specimen: Urine   Urine  Result Value Ref Range   Trichomonas Exam Negative Negative   Yeast Exam Negative Negative   Clue Cell Exam Positive (A) Negative  Microscopic Examination   Collection Time: 08/07/23  3:17 PM   Urine  Result Value Ref Range   WBC, UA 6-10 (A) 0 - 5 /hpf   RBC, Urine 0-2 0 - 2 /hpf   Epithelial Cells (non renal) 0-10 0 - 10 /hpf   Mucus, UA Present (A) Not Estab.   Bacteria, UA None seen None seen/Few  Urinalysis, Routine w reflex microscopic   Collection Time: 08/07/23  3:17 PM  Result Value Ref Range   Specific Gravity, UA >1.030 (H) 1.005 - 1.030   pH, UA 5.5 5.0 - 7.5   Color, UA Yellow Yellow   Appearance Ur Cloudy (A) Clear   Leukocytes,UA 1+ (A) Negative   Protein,UA Trace (A) Negative/Trace   Glucose, UA Negative Negative   Ketones, UA Negative Negative   RBC, UA 1+ (A) Negative   Bilirubin, UA Negative Negative   Urobilinogen, Ur 0.2 0.2 - 1.0 mg/dL   Nitrite, UA Negative Negative   Microscopic Examination See below:   Urine Culture   Collection Time: 08/07/23  3:35 PM  Specimen: Urine   UR  Result Value Ref Range   Urine Culture, Routine Final report    Organism ID, Bacteria Comment       Assessment & Plan:   Problem List Items Addressed This Visit   None      Follow up plan: Return in about 3 months (around 05/13/2024) for Physical and Fasting labs.   This visit was completed via MyChart due to the restrictions of the COVID-19 pandemic. All issues as above were discussed and addressed. Physical exam was done as above through visual confirmation on MyChart. If it was felt that the patient should be evaluated in the office, they were directed there. The patient verbally consented to this visit. Location of the patient: Home Location of the provider: Office Those involved with this call:  Provider: Aileen Alexanders, NP CMA: NA Front Desk/Registration: Jaynee Meyer This encounter was conducted via Video.  I  spent 30 dedicated to the care of this patient on the date of this encounter to include previsit review of symptoms, plan of care and follow up, face to face time with the patient, and post visit ordering of testing.

## 2024-02-12 NOTE — Assessment & Plan Note (Signed)
 Chronic.  Controlled.  Continue with current medication regimen of Citalopram .  Refills sent today.  Return to clinic in 3 months for reevaluation.  Call sooner if concerns arise.

## 2024-02-21 ENCOUNTER — Other Ambulatory Visit: Payer: Self-pay | Admitting: Nurse Practitioner

## 2024-02-21 NOTE — Progress Notes (Signed)
 Called patient left message for patient to call office and schedule 3 months (around 05/13/2024) for Physical and Fasting labs.

## 2024-02-25 NOTE — Telephone Encounter (Signed)
 Requested Prescriptions  Pending Prescriptions Disp Refills   valACYclovir  (VALTREX ) 1000 MG tablet [Pharmacy Med Name: VALACYCLOVIR  1GM TABLETS] 180 tablet 0    Sig: TAKE 1 TABLET(1000 MG) BY MOUTH TWICE DAILY     Antimicrobials:  Antiviral Agents - Anti-Herpetic Passed - 02/25/2024 10:56 AM      Passed - Valid encounter within last 12 months    Recent Outpatient Visits           2 weeks ago Mild episode of recurrent major depressive disorder [facility])   [place] [facility] Melvin Pao, NP

## 2024-02-25 NOTE — Telephone Encounter (Incomplete)
 Requested Prescriptions  Pending Prescriptions Disp Refills   valACYclovir  (VALTREX ) 1000 MG tablet [Pharmacy Med Name: VALACYCLOVIR  1GM TABLETS] 180 tablet 0    Sig: TAKE 1 TABLET(1000 MG) BY MOUTH TWICE DAILY     Antimicrobials:  Antiviral Agents - Anti-Herpetic Passed - 02/25/2024 10:43 AM      Passed - Valid encounter within last 12 months    Recent Outpatient Visits           2 weeks ago Mild episode of recurrent major depressive disorder [facility])   [place] [facility] Melvin Pao, NP

## 2024-02-28 NOTE — Progress Notes (Signed)
 Appointment has been made and mailed to the patient

## 2024-06-01 ENCOUNTER — Encounter: Admitting: Nurse Practitioner

## 2024-06-01 NOTE — Progress Notes (Deleted)
 There were no vitals taken for this visit.   Subjective:    Patient ID: Tammy Harper, female    DOB: 18-Jan-1981, 43 y.o.   MRN: [id number]  HPI: Tammy Harper is a 43 y.o. female presenting on 06/01/2024 for comprehensive medical examination. Current medical complaints include:{Blank single:19197::none,***}  She currently lives with: Menopausal Symptoms: {Blank single:19197::yes,no}  DEPRESSION Patient states she has been out of the citalopram  for about two weeks.  States she feels like she has been more emotional and feels like that is the reason.  She is no longer on the Citalopram .   Mood status: improved. Satisfied with current treatment?: yes Symptom severity: severe  Duration of current treatment : years Side effects: no Medication compliance: excellent compliance  Depression Screen done today and results listed below:     08/23/2023    8:33 AM 08/07/2023    3:10 PM 07/29/2023    8:42 AM 06/24/2023    8:48 AM 11/22/2022    4:17 PM  Depression screen PHQ 2/9  Decreased Interest 2 0 0 1 0  Down, Depressed, Hopeless 3 1 1 1 1   PHQ - 2 Score 5 1 1 2 1   Altered sleeping 3 0 0 0 0  Tired, decreased energy 0 0 0 0 0  Change in appetite 3 0 0 0 1  Feeling bad or failure about yourself  3 0 1 1 1   Trouble concentrating 0 0 0 0 0  Moving slowly or fidgety/restless 0 0 0 0 0  Suicidal thoughts 0 0 0 0 0  PHQ-9 Score 14 1 2 3 3   Difficult doing work/chores Very difficult  Not difficult at all Not difficult at all Not difficult at all    The patient {has/does not have:19849} a history of falls. I {did/did not:19850} complete a risk assessment for falls. A plan of care for falls {was/was not:19852} documented.   Past Medical History:  Past Medical History:  Diagnosis Date   Depression     Surgical History:  No past surgical history on file.  Medications:  Current Outpatient Medications on File Prior to Visit  Medication Sig   citalopram  (CELEXA )  20 MG tablet Take 1 tablet (20 mg total) by mouth daily.   drospirenone -ethinyl estradiol  (OCELLA ) 3-0.03 MG tablet Take 1 tablet by mouth daily.   nitrofurantoin , macrocrystal-monohydrate, (MACROBID ) 100 MG capsule Take 1 capsule (100 mg total) by mouth 2 (two) times daily.   Semaglutide-Weight Management 0.25 MG/0.5ML SOAJ Inject 0.25 mg into the skin.   topiramate  (TOPAMAX ) 25 MG tablet Take 1 tablet (25 mg total) by mouth 2 (two) times daily.   valACYclovir  (VALTREX ) 1000 MG tablet TAKE 1 TABLET(1000 MG) BY MOUTH TWICE DAILY   No current facility-administered medications on file prior to visit.    Allergies:  Allergies  Allergen Reactions   Zoloft  [Sertraline ]     GI Issues    Social History:  Social History   Socioeconomic History   Marital status: Married    Spouse name: Not on file   Number of children: Not on file   Years of education: Not on file   Highest education level: Not on file  Occupational History   Not on file  Tobacco Use   Smoking status: Some Days   Smokeless tobacco: Never  Vaping Use   Vaping status: Never Used  Substance and Sexual Activity   Alcohol use: Yes    Comment: once a week/ socially   Drug use: Never  Sexual activity: Yes  Other Topics Concern   Not on file  Social History Narrative   ** Merged History Encounter **       Social Drivers of Corporate investment banker Strain: Not on file  Food Insecurity: Not on file  Transportation Needs: Not on file  Physical Activity: Not on file  Stress: Not on file  Social Connections: Not on file  Intimate Partner Violence: Not on file   Social History   Tobacco Use  Smoking Status Some Days  Smokeless Tobacco Never   Social History   Substance and Sexual Activity  Alcohol Use Yes   Comment: once a week/ socially    Family History:  Family History  Problem Relation Age of Onset   Thyroid  disease Father    Emphysema Maternal Grandmother    Emphysema Maternal Grandfather     Diabetes Paternal Grandfather     Past medical history, surgical history, medications, allergies, family history and social history reviewed with patient today and changes made to appropriate areas of the chart.   ROS All other ROS negative except what is listed above and in the HPI.      Objective:    There were no vitals taken for this visit.  Wt Readings from Last 3 Encounters:  09/11/23 132 lb (59.9 kg)  08/07/23 132 lb 9.6 oz (60.1 kg)  07/29/23 136 lb (61.7 kg)    Physical Exam  Results for orders placed or performed in visit on 08/07/23  WET PREP FOR TRICH, YEAST, CLUE   Collection Time: 08/07/23  3:17 PM   Specimen: Urine   Urine  Result Value Ref Range   Trichomonas Exam Negative Negative   Yeast Exam Negative Negative   Clue Cell Exam Positive (A) Negative  Microscopic Examination   Collection Time: 08/07/23  3:17 PM   Urine  Result Value Ref Range   WBC, UA 6-10 (A) 0 - 5 /hpf   RBC, Urine 0-2 0 - 2 /hpf   Epithelial Cells (non renal) 0-10 0 - 10 /hpf   Mucus, UA Present (A) Not Estab.   Bacteria, UA None seen None seen/Few  Urinalysis, Routine w reflex microscopic   Collection Time: 08/07/23  3:17 PM  Result Value Ref Range   Specific Gravity, UA >1.030 (H) 1.005 - 1.030   pH, UA 5.5 5.0 - 7.5   Color, UA Yellow Yellow   Appearance Ur Cloudy (A) Clear   Leukocytes,UA 1+ (A) Negative   Protein,UA Trace (A) Negative/Trace   Glucose, UA Negative Negative   Ketones, UA Negative Negative   RBC, UA 1+ (A) Negative   Bilirubin, UA Negative Negative   Urobilinogen, Ur 0.2 0.2 - 1.0 mg/dL   Nitrite, UA Negative Negative   Microscopic Examination See below:   Urine Culture   Collection Time: 08/07/23  3:35 PM   Specimen: Urine   UR  Result Value Ref Range   Urine Culture, Routine Final report    Organism ID, Bacteria Comment       Assessment & Plan:   Problem List Items Addressed This Visit       Other   Depression - Primary     Follow up  plan: No follow-ups on file.   LABORATORY TESTING:  - Pap smear: {Blank single:19197::pap done,not applicable,up to date,done elsewhere}  IMMUNIZATIONS:   - Tdap: Tetanus vaccination status reviewed: {tetanus status:315746}. - Influenza: {Blank single:19197::Up to date,Administered today,Postponed to flu season,Refused,Given elsewhere} - Pneumovax: {Blank single:19197::Up to  date,Administered today,Not applicable,Refused,Given elsewhere} - Prevnar: {Blank single:19197::Up to date,Administered today,Not applicable,Refused,Given elsewhere} - COVID: {Blank single:19197::Up to date,Administered today,Not applicable,Refused,Given elsewhere} - HPV: {Blank single:19197::Up to date,Administered today,Not applicable,Refused,Given elsewhere} - Shingrix vaccine: {Blank single:19197::Up to date,Administered today,Not applicable,Refused,Given elsewhere}  SCREENING: -Mammogram: {Blank single:19197::Up to date,Ordered today,Not applicable,Refused,Done elsewhere}  - Colonoscopy: {Blank single:19197::Up to date,Ordered today,Not applicable,Refused,Done elsewhere}  - Bone Density: {Blank single:19197::Up to date,Ordered today,Not applicable,Refused,Done elsewhere}  -Hearing Test: {Blank single:19197::Up to date,Ordered today,Not applicable,Refused,Done elsewhere}  -Spirometry: {Blank single:19197::Up to date,Ordered today,Not applicable,Refused,Done elsewhere}   PATIENT COUNSELING:   Advised to take 1 mg of folate supplement per day if capable of pregnancy.   Sexuality: Discussed sexually transmitted diseases, partner selection, use of condoms, avoidance of unintended pregnancy  and contraceptive alternatives.   Advised to avoid cigarette smoking.  I discussed with the patient that most people either abstain from alcohol or drink within safe limits (<=14/week and <=4 drinks/occasion for  males, <=7/weeks and <= 3 drinks/occasion for females) and that the risk for alcohol disorders and other health effects rises proportionally with the number of drinks per week and how often a drinker exceeds daily limits.  Discussed cessation/primary prevention of drug use and availability of treatment for abuse.   Diet: Encouraged to adjust caloric intake to maintain  or achieve ideal body weight, to reduce intake of dietary saturated fat and total fat, to limit sodium intake by avoiding high sodium foods and not adding table salt, and to maintain adequate dietary potassium and calcium preferably from fresh fruits, vegetables, and low-fat dairy products.    stressed the importance of regular exercise  Injury prevention: Discussed safety belts, safety helmets, smoke detector, smoking near bedding or upholstery.   Dental health: Discussed importance of regular tooth brushing, flossing, and dental visits.    NEXT PREVENTATIVE PHYSICAL DUE IN 1 YEAR. No follow-ups on file.

## 2024-06-15 ENCOUNTER — Encounter: Payer: Self-pay | Admitting: Nurse Practitioner

## 2024-06-15 ENCOUNTER — Other Ambulatory Visit: Payer: Self-pay | Admitting: Nurse Practitioner

## 2024-06-17 MED ORDER — CITALOPRAM HYDROBROMIDE 20 MG PO TABS: 20.0000 mg | ORAL_TABLET | Freq: Every day | ORAL | 0 refills | Status: DC

## 2024-06-18 NOTE — Telephone Encounter (Signed)
 Requested Prescriptions  Refused Prescriptions Disp Refills   citalopram  (CELEXA ) 20 MG tablet [Pharmacy Med Name: CITALOPRAM  20MG  TABLETS] 90 tablet 0    Sig: TAKE 1 TABLET(20 MG) BY MOUTH DAILY     Psychiatry:  Antidepressants - SSRI Passed - 06/18/2024 11:00 AM      Passed - Completed PHQ-2 or PHQ-9 in the last 360 days      Passed - Valid encounter within last 6 months    Recent Outpatient Visits           4 months ago Mild episode of recurrent major depressive disorder   [place] [facility] Melvin Pao, NP

## 2024-07-07 ENCOUNTER — Ambulatory Visit: Admitting: Nurse Practitioner

## 2024-07-16 ENCOUNTER — Other Ambulatory Visit: Payer: Self-pay | Admitting: Nurse Practitioner

## 2024-07-17 NOTE — Telephone Encounter (Signed)
 Requested Prescriptions  Pending Prescriptions Disp Refills   citalopram  (CELEXA ) 20 MG tablet [Pharmacy Med Name: CITALOPRAM  20MG  TABLETS] 30 tablet 0    Sig: TAKE 1 TABLET(20 MG) BY MOUTH DAILY     Psychiatry:  Antidepressants - SSRI Passed - 07/17/2024  4:54 PM      Passed - Completed PHQ-2 or PHQ-9 in the last 360 days      Passed - Valid encounter within last 6 months    Recent Outpatient Visits           5 months ago Mild episode of recurrent major depressive disorder    [facility] Melvin Pao, NP

## 2024-07-20 ENCOUNTER — Ambulatory Visit: Admitting: Nurse Practitioner

## 2024-07-20 NOTE — Progress Notes (Deleted)
 There were no vitals taken for this visit.   Subjective:    Patient ID: Tammy Harper, female    DOB: 17-Jun-1981, 43 y.o.   MRN: [id number]  HPI: Tammy Harper is a 43 y.o. female presenting on 07/20/2024 for comprehensive medical examination. Current medical complaints include:{Blank single:19197::none,***}  She currently lives with: Menopausal Symptoms: {Blank single:19197::yes,no}  DEPRESSION Patient states she has been out of the citalopram  for about two weeks.  States she feels like she has been more emotional and feels like that is the reason.  She is no longer on the Citalopram .   Mood status: improved. Satisfied with current treatment?: yes Symptom severity: severe  Duration of current treatment : years Side effects: no Medication compliance: excellent compliance  Depression Screen done today and results listed below:     08/23/2023    8:33 AM 08/07/2023    3:10 PM 07/29/2023    8:42 AM 06/24/2023    8:48 AM 11/22/2022    4:17 PM  Depression screen PHQ 2/9  Decreased Interest 2 0 0 1 0  Down, Depressed, Hopeless 3 1 1 1 1   PHQ - 2 Score 5 1 1 2 1   Altered sleeping 3 0 0 0 0  Tired, decreased energy 0 0 0 0 0  Change in appetite 3 0 0 0 1  Feeling bad or failure about yourself  3 0 1 1 1   Trouble concentrating 0 0 0 0 0  Moving slowly or fidgety/restless 0 0 0 0 0  Suicidal thoughts 0 0 0 0 0  PHQ-9 Score 14  1  2  3  3    Difficult doing work/chores Very difficult  Not difficult at all Not difficult at all Not difficult at all     Data saved with a previous flowsheet row definition    The patient {has/does not have:19849} a history of falls. I {did/did not:19850} complete a risk assessment for falls. A plan of care for falls {was/was not:19852} documented.   Past Medical History:  Past Medical History:  Diagnosis Date   Depression     Surgical History:  No past surgical history on file.  Medications:  Current Outpatient Medications  on File Prior to Visit  Medication Sig   citalopram  (CELEXA ) 20 MG tablet TAKE 1 TABLET(20 MG) BY MOUTH DAILY   drospirenone -ethinyl estradiol  (OCELLA ) 3-0.03 MG tablet Take 1 tablet by mouth daily.   nitrofurantoin , macrocrystal-monohydrate, (MACROBID ) 100 MG capsule Take 1 capsule (100 mg total) by mouth 2 (two) times daily.   Semaglutide-Weight Management 0.25 MG/0.5ML SOAJ Inject 0.25 mg into the skin.   topiramate  (TOPAMAX ) 25 MG tablet Take 1 tablet (25 mg total) by mouth 2 (two) times daily.   valACYclovir  (VALTREX ) 1000 MG tablet TAKE 1 TABLET(1000 MG) BY MOUTH TWICE DAILY   No current facility-administered medications on file prior to visit.    Allergies:  Allergies  Allergen Reactions   Zoloft  [Sertraline ]     GI Issues    Social History:  Social History   Socioeconomic History   Marital status: Married    Spouse name: Not on file   Number of children: Not on file   Years of education: Not on file   Highest education level: Not on file  Occupational History   Not on file  Tobacco Use   Smoking status: Some Days   Smokeless tobacco: Never  Vaping Use   Vaping status: Never Used  Substance and Sexual Activity   Alcohol  use: Yes    Comment: once a week/ socially   Drug use: Never   Sexual activity: Yes  Other Topics Concern   Not on file  Social History Narrative   ** Merged History Encounter **       Social Drivers of Corporate Investment Banker Strain: Not on file  Food Insecurity: Not on file  Transportation Needs: Not on file  Physical Activity: Not on file  Stress: Not on file  Social Connections: Not on file  Intimate Partner Violence: Not on file   Social History   Tobacco Use  Smoking Status Some Days  Smokeless Tobacco Never   Social History   Substance and Sexual Activity  Alcohol Use Yes   Comment: once a week/ socially    Family History:  Family History  Problem Relation Age of Onset   Thyroid  disease Father    Emphysema  Maternal Grandmother    Emphysema Maternal Grandfather    Diabetes Paternal Grandfather     Past medical history, surgical history, medications, allergies, family history and social history reviewed with patient today and changes made to appropriate areas of the chart.   ROS All other ROS negative except what is listed above and in the HPI.      Objective:    There were no vitals taken for this visit.  Wt Readings from Last 3 Encounters:  09/11/23 132 lb (59.9 kg)  08/07/23 132 lb 9.6 oz (60.1 kg)  07/29/23 136 lb (61.7 kg)    Physical Exam  Results for orders placed or performed in visit on 08/07/23  WET PREP FOR TRICH, YEAST, CLUE   Collection Time: 08/07/23  3:17 PM   Specimen: Urine   Urine  Result Value Ref Range   Trichomonas Exam Negative Negative   Yeast Exam Negative Negative   Clue Cell Exam Positive (A) Negative  Microscopic Examination   Collection Time: 08/07/23  3:17 PM   Urine  Result Value Ref Range   WBC, UA 6-10 (A) 0 - 5 /hpf   RBC, Urine 0-2 0 - 2 /hpf   Epithelial Cells (non renal) 0-10 0 - 10 /hpf   Mucus, UA Present (A) Not Estab.   Bacteria, UA None seen None seen/Few  Urinalysis, Routine w reflex microscopic   Collection Time: 08/07/23  3:17 PM  Result Value Ref Range   Specific Gravity, UA >1.030 (H) 1.005 - 1.030   pH, UA 5.5 5.0 - 7.5   Color, UA Yellow Yellow   Appearance Ur Cloudy (A) Clear   Leukocytes,UA 1+ (A) Negative   Protein,UA Trace (A) Negative/Trace   Glucose, UA Negative Negative   Ketones, UA Negative Negative   RBC, UA 1+ (A) Negative   Bilirubin, UA Negative Negative   Urobilinogen, Ur 0.2 0.2 - 1.0 mg/dL   Nitrite, UA Negative Negative   Microscopic Examination See below:   Urine Culture   Collection Time: 08/07/23  3:35 PM   Specimen: Urine   UR  Result Value Ref Range   Urine Culture, Routine Final report    Organism ID, Bacteria Comment       Assessment & Plan:   Problem List Items Addressed This Visit    None    Follow up plan: No follow-ups on file.   LABORATORY TESTING:  - Pap smear: {Blank single:19197::pap done,not applicable,up to date,done elsewhere}  IMMUNIZATIONS:   - Tdap: Tetanus vaccination status reviewed: {tetanus status:315746}. - Influenza: {Blank single:19197::Up to date,Administered today,Postponed to flu  season,Refused,Given elsewhere} - Pneumovax: {Blank single:19197::Up to date,Administered today,Not applicable,Refused,Given elsewhere} - Prevnar: {Blank single:19197::Up to date,Administered today,Not applicable,Refused,Given elsewhere} - COVID: {Blank single:19197::Up to date,Administered today,Not applicable,Refused,Given elsewhere} - HPV: {Blank single:19197::Up to date,Administered today,Not applicable,Refused,Given elsewhere} - Shingrix vaccine: {Blank single:19197::Up to date,Administered today,Not applicable,Refused,Given elsewhere}  SCREENING: -Mammogram: {Blank single:19197::Up to date,Ordered today,Not applicable,Refused,Done elsewhere}  - Colonoscopy: {Blank single:19197::Up to date,Ordered today,Not applicable,Refused,Done elsewhere}  - Bone Density: {Blank single:19197::Up to date,Ordered today,Not applicable,Refused,Done elsewhere}  -Hearing Test: {Blank single:19197::Up to date,Ordered today,Not applicable,Refused,Done elsewhere}  -Spirometry: {Blank single:19197::Up to date,Ordered today,Not applicable,Refused,Done elsewhere}   PATIENT COUNSELING:   Advised to take 1 mg of folate supplement per day if capable of pregnancy.   Sexuality: Discussed sexually transmitted diseases, partner selection, use of condoms, avoidance of unintended pregnancy  and contraceptive alternatives.   Advised to avoid cigarette smoking.  I discussed with the patient that most people either abstain from alcohol or drink within safe limits (<=14/week and  <=4 drinks/occasion for males, <=7/weeks and <= 3 drinks/occasion for females) and that the risk for alcohol disorders and other health effects rises proportionally with the number of drinks per week and how often a drinker exceeds daily limits.  Discussed cessation/primary prevention of drug use and availability of treatment for abuse.   Diet: Encouraged to adjust caloric intake to maintain  or achieve ideal body weight, to reduce intake of dietary saturated fat and total fat, to limit sodium intake by avoiding high sodium foods and not adding table salt, and to maintain adequate dietary potassium and calcium preferably from fresh fruits, vegetables, and low-fat dairy products.    stressed the importance of regular exercise  Injury prevention: Discussed safety belts, safety helmets, smoke detector, smoking near bedding or upholstery.   Dental health: Discussed importance of regular tooth brushing, flossing, and dental visits.    NEXT PREVENTATIVE PHYSICAL DUE IN 1 YEAR. No follow-ups on file.

## 2024-08-25 ENCOUNTER — Ambulatory Visit: Admitting: Nurse Practitioner

## 2024-08-25 ENCOUNTER — Encounter: Payer: Self-pay | Admitting: Nurse Practitioner

## 2024-08-25 VITALS — BP 134/91 | HR 68 | Temp 99.2°F | Wt 133.4 lb

## 2024-08-25 DIAGNOSIS — Z1231 Encounter for screening mammogram for malignant neoplasm of breast: Secondary | ICD-10-CM

## 2024-08-25 DIAGNOSIS — Z136 Encounter for screening for cardiovascular disorders: Secondary | ICD-10-CM

## 2024-08-25 DIAGNOSIS — Z0000 Encounter for general adult medical examination without abnormal findings: Secondary | ICD-10-CM

## 2024-08-25 MED ORDER — DROSPIRENONE-ETHINYL ESTRADIOL 3-0.03 MG PO TABS: 1.0000 | ORAL_TABLET | Freq: Every day | ORAL | 3 refills | Status: AC

## 2024-08-25 MED ORDER — CITALOPRAM HYDROBROMIDE 20 MG PO TABS: 20.0000 mg | ORAL_TABLET | Freq: Every day | ORAL | 1 refills | Status: AC

## 2024-08-25 NOTE — Patient Instructions (Signed)
 You have an order for:  []   2D Mammogram  [x]   3D Mammogram  []   Bone Density     Please call for appointment:  [facility]  [address] [place] [address] [phone] [facility] [address] [place], [place] [address] [phone] [facility] [address] [place], [place] [address] [phone]   Make sure to wear two-piece clothing.  No lotions, powders, or deodorants the day of the appointment. Make sure to bring picture ID and insurance card.  Bring list of medications you are currently taking including any supplements.   Schedule your [place] screening mammogram through MyChart!   Log into your MyChart account.  Go to 'Visit' (or 'Appointments' if on mobile App) --> Schedule an Appointment  Under 'Select a Reason for Visit' choose the Mammogram Screening option.  Complete the pre-visit questions and select the time and place that best fits your schedule.

## 2024-08-25 NOTE — Progress Notes (Signed)
 "  BP (!) 134/91 (BP Location: Right Arm, Patient Position: Sitting, Cuff Size: Normal)   Pulse 68   Temp 99.2 F (37.3 C) (Oral)   Wt 133 lb 6.4 oz (60.5 kg)   LMP 08/10/2024 (Approximate)   SpO2 98%   BMI 22.90 kg/m    Subjective:    Patient ID: Delon Claudene Law, female    DOB: 03/05/1981, 43 y.o.   MRN: [id number]  HPI: Kesia Dalto is a 43 y.o. female presenting on 08/25/2024 for comprehensive medical examination. Current medical complaints include:none  She currently lives with: Menopausal Symptoms: no  DEPRESSION Patient states she has been feeling really good.  The citalopram  is working well for her.  Denies concerns regarding the medication today.  Feels like this is a good dose for her.   Satisfied with current treatment?: yes Symptom severity: severe  Duration of current treatment : years Side effects: no Medication compliance: excellent compliance Depression Screen done today and results listed below:     08/23/2023    8:33 AM 08/07/2023    3:10 PM 07/29/2023    8:42 AM 06/24/2023    8:48 AM 11/22/2022    4:17 PM  Depression screen PHQ 2/9  Decreased Interest 2 0 0 1 0  Down, Depressed, Hopeless 3 1 1 1 1   PHQ - 2 Score 5 1 1 2 1   Altered sleeping 3 0 0 0 0  Tired, decreased energy 0 0 0 0 0  Change in appetite 3 0 0 0 1  Feeling bad or failure about yourself  3 0 1 1 1   Trouble concentrating 0 0 0 0 0  Moving slowly or fidgety/restless 0 0 0 0 0  Suicidal thoughts 0 0 0 0 0  PHQ-9 Score 14  1  2  3  3    Difficult doing work/chores Very difficult  Not difficult at all Not difficult at all Not difficult at all     Data saved with a previous flowsheet row definition    The patient does not have a history of falls. I did complete a risk assessment for falls. A plan of care for falls was documented.   Past Medical History:  Past Medical History:  Diagnosis Date   Depression     Surgical History:  History reviewed. No pertinent surgical  history.  Medications:  Current Outpatient Medications on File Prior to Visit  Medication Sig   Semaglutide-Weight Management 0.25 MG/0.5ML SOAJ Inject 0.25 mg into the skin.   valACYclovir  (VALTREX ) 1000 MG tablet TAKE 1 TABLET(1000 MG) BY MOUTH TWICE DAILY   No current facility-administered medications on file prior to visit.    Allergies:  Allergies[1]  Social History:  Social History   Socioeconomic History   Marital status: Married    Spouse name: Not on file   Number of children: Not on file   Years of education: Not on file   Highest education level: Not on file  Occupational History   Not on file  Tobacco Use   Smoking status: Some Days   Smokeless tobacco: Never  Vaping Use   Vaping status: Never Used  Substance and Sexual Activity   Alcohol use: Yes    Comment: once a week/ socially   Drug use: Never   Sexual activity: Yes  Other Topics Concern   Not on file  Social History Narrative   ** Merged History Encounter **       Social Drivers of Health   Tobacco  Use: High Risk (08/25/2024)   Patient History    Smoking Tobacco Use: Some Days    Smokeless Tobacco Use: Never    Passive Exposure: Not on file  Financial Resource Strain: Not on file  Food Insecurity: Not on file  Transportation Needs: Not on file  Physical Activity: Not on file  Stress: Not on file  Social Connections: Not on file  Intimate Partner Violence: Not on file  Depression (PHQ2-9): High Risk (08/23/2023)   Depression (PHQ2-9)    PHQ-2 Score: 14  Alcohol Screen: Not on file  Housing: Not on file  Utilities: Not on file  Health Literacy: Not on file   Tobacco Use History[2] Social History   Substance and Sexual Activity  Alcohol Use Yes   Comment: once a week/ socially    Family History:  Family History  Problem Relation Age of Onset   Thyroid  disease Father    Emphysema Maternal Grandmother    Emphysema Maternal Grandfather    Diabetes Paternal Grandfather      Past medical history, surgical history, medications, allergies, family history and social history reviewed with patient today and changes made to appropriate areas of the chart.   Review of Systems  Psychiatric/Behavioral:  Positive for depression. Negative for suicidal ideas. The patient is nervous/anxious.   All other systems reviewed and are negative.  All other ROS negative except what is listed above and in the HPI.      Objective:    BP (!) 134/91 (BP Location: Right Arm, Patient Position: Sitting, Cuff Size: Normal)   Pulse 68   Temp 99.2 F (37.3 C) (Oral)   Wt 133 lb 6.4 oz (60.5 kg)   LMP 08/10/2024 (Approximate)   SpO2 98%   BMI 22.90 kg/m   Wt Readings from Last 3 Encounters:  08/25/24 133 lb 6.4 oz (60.5 kg)  09/11/23 132 lb (59.9 kg)  08/07/23 132 lb 9.6 oz (60.1 kg)    Physical Exam Vitals and nursing note reviewed.  Constitutional:      General: She is awake. She is not in acute distress.    Appearance: Normal appearance. She is well-developed and normal weight. She is not ill-appearing.  HENT:     Head: Normocephalic and atraumatic.     Right Ear: Hearing, tympanic membrane, ear canal and external ear normal. No drainage.     Left Ear: Hearing, tympanic membrane, ear canal and external ear normal. No drainage.     Nose: Nose normal.     Right Sinus: No maxillary sinus tenderness or frontal sinus tenderness.     Left Sinus: No maxillary sinus tenderness or frontal sinus tenderness.     Mouth/Throat:     Mouth: Mucous membranes are moist.     Pharynx: Oropharynx is clear. Uvula midline. No pharyngeal swelling, oropharyngeal exudate or posterior oropharyngeal erythema.  Eyes:     General: Lids are normal.        Right eye: No discharge.        Left eye: No discharge.     Extraocular Movements: Extraocular movements intact.     Conjunctiva/sclera: Conjunctivae normal.     Pupils: Pupils are equal, round, and reactive to light.     Visual Fields: Right  eye visual fields normal and left eye visual fields normal.  Neck:     Thyroid : No thyromegaly.     Vascular: No carotid bruit.     Trachea: Trachea normal.  Cardiovascular:     Rate and Rhythm: Normal  rate and regular rhythm.     Heart sounds: Normal heart sounds. No murmur heard.    No gallop.  Pulmonary:     Effort: Pulmonary effort is normal. No accessory muscle usage or respiratory distress.     Breath sounds: Normal breath sounds.  Chest:  Breasts:    Right: Normal.     Left: Normal.  Abdominal:     General: Bowel sounds are normal.     Palpations: Abdomen is soft. There is no hepatomegaly or splenomegaly.     Tenderness: There is no abdominal tenderness.  Musculoskeletal:        General: Normal range of motion.     Cervical back: Normal range of motion and neck supple.     Right lower leg: No edema.     Left lower leg: No edema.  Lymphadenopathy:     Head:     Right side of head: No submental, submandibular, tonsillar, preauricular or posterior auricular adenopathy.     Left side of head: No submental, submandibular, tonsillar, preauricular or posterior auricular adenopathy.     Cervical: No cervical adenopathy.     Upper Body:     Right upper body: No supraclavicular, axillary or pectoral adenopathy.     Left upper body: No supraclavicular, axillary or pectoral adenopathy.  Skin:    General: Skin is warm and dry.     Capillary Refill: Capillary refill takes less than 2 seconds.     Findings: No rash.  Neurological:     Mental Status: She is alert and oriented to person, place, and time.     Gait: Gait is intact.  Psychiatric:        Attention and Perception: Attention normal.        Mood and Affect: Mood normal.        Speech: Speech normal.        Behavior: Behavior normal. Behavior is cooperative.        Thought Content: Thought content normal.        Judgment: Judgment normal.     Results for orders placed or performed in visit on 08/07/23  WET PREP FOR  TRICH, YEAST, CLUE   Collection Time: 08/07/23  3:17 PM   Specimen: Urine   Urine  Result Value Ref Range   Trichomonas Exam Negative Negative   Yeast Exam Negative Negative   Clue Cell Exam Positive (A) Negative  Microscopic Examination   Collection Time: 08/07/23  3:17 PM   Urine  Result Value Ref Range   WBC, UA 6-10 (A) 0 - 5 /hpf   RBC, Urine 0-2 0 - 2 /hpf   Epithelial Cells (non renal) 0-10 0 - 10 /hpf   Mucus, UA Present (A) Not Estab.   Bacteria, UA None seen None seen/Few  Urinalysis, Routine w reflex microscopic   Collection Time: 08/07/23  3:17 PM  Result Value Ref Range   Specific Gravity, UA >1.030 (H) 1.005 - 1.030   pH, UA 5.5 5.0 - 7.5   Color, UA Yellow Yellow   Appearance Ur Cloudy (A) Clear   Leukocytes,UA 1+ (A) Negative   Protein,UA Trace (A) Negative/Trace   Glucose, UA Negative Negative   Ketones, UA Negative Negative   RBC, UA 1+ (A) Negative   Bilirubin, UA Negative Negative   Urobilinogen, Ur 0.2 0.2 - 1.0 mg/dL   Nitrite, UA Negative Negative   Microscopic Examination See below:   Urine Culture   Collection Time: 08/07/23  3:35 PM  Specimen: Urine   UR  Result Value Ref Range   Urine Culture, Routine Final report    Organism ID, Bacteria Comment       Assessment & Plan:   Problem List Items Addressed This Visit   None Visit Diagnoses       Annual physical exam    -  Primary   Health maintenance reviewed during visit today.  Labs ordered.  Vaccines reviewed. PAP up to date.  Mammogram ordered.   Relevant Orders   CBC with Differential/Platelet   Comprehensive metabolic panel with GFR   Lipid panel   TSH     Screening for ischemic heart disease       Relevant Orders   Lipid panel     Screening mammogram for breast cancer       Relevant Orders   MM 3D SCREENING MAMMOGRAM BILATERAL BREAST        Follow up plan: Return in about 6 months (around 02/23/2025) for Depression/Anxiety FU.   LABORATORY TESTING:  - Pap smear: up to  date  IMMUNIZATIONS:   - Tdap: Tetanus vaccination status reviewed: last tetanus booster within 10 years. - Influenza: Refused - Pneumovax: Refused - Prevnar: Refused - COVID: Refused - HPV: Refused - Shingrix vaccine: Not applicable  SCREENING: -Mammogram: Ordered today  - Colonoscopy: Not applicable  - Bone Density: Not applicable  -Hearing Test: Not applicable  -Spirometry: Not applicable   PATIENT COUNSELING:   Advised to take 1 mg of folate supplement per day if capable of pregnancy.   Sexuality: Discussed sexually transmitted diseases, partner selection, use of condoms, avoidance of unintended pregnancy  and contraceptive alternatives.   Advised to avoid cigarette smoking.  I discussed with the patient that most people either abstain from alcohol or drink within safe limits (<=14/week and <=4 drinks/occasion for males, <=7/weeks and <= 3 drinks/occasion for females) and that the risk for alcohol disorders and other health effects rises proportionally with the number of drinks per week and how often a drinker exceeds daily limits.  Discussed cessation/primary prevention of drug use and availability of treatment for abuse.   Diet: Encouraged to adjust caloric intake to maintain  or achieve ideal body weight, to reduce intake of dietary saturated fat and total fat, to limit sodium intake by avoiding high sodium foods and not adding table salt, and to maintain adequate dietary potassium and calcium preferably from fresh fruits, vegetables, and low-fat dairy products.    stressed the importance of regular exercise  Injury prevention: Discussed safety belts, safety helmets, smoke detector, smoking near bedding or upholstery.   Dental health: Discussed importance of regular tooth brushing, flossing, and dental visits.    NEXT PREVENTATIVE PHYSICAL DUE IN 1 YEAR. Return in about 6 months (around 02/23/2025) for Depression/Anxiety FU.             [1]   Allergies Allergen Reactions   Zoloft  [Sertraline ]     GI Issues  [2]  Social History Tobacco Use  Smoking Status Some Days  Smokeless Tobacco Never   "

## 2024-08-26 LAB — LIPID PANEL
Chol/HDL Ratio: 2 ratio (ref 0.0–4.4)
Cholesterol, Total: 174 mg/dL (ref 100–199)
HDL: 86 mg/dL
LDL Chol Calc (NIH): 74 mg/dL (ref 0–99)
Triglycerides: 77 mg/dL (ref 0–149)
VLDL Cholesterol Cal: 14 mg/dL (ref 5–40)

## 2024-08-26 LAB — COMPREHENSIVE METABOLIC PANEL WITH GFR
ALT: 12 IU/L (ref 0–32)
AST: 19 IU/L (ref 0–40)
Albumin: 4 g/dL (ref 3.9–4.9)
Alkaline Phosphatase: 46 IU/L (ref 41–116)
BUN/Creatinine Ratio: 26 — ABNORMAL HIGH (ref 9–23)
BUN: 16 mg/dL (ref 6–24)
Bilirubin Total: 0.3 mg/dL (ref 0.0–1.2)
CO2: 23 mmol/L (ref 20–29)
Calcium: 9.3 mg/dL (ref 8.7–10.2)
Chloride: 99 mmol/L (ref 96–106)
Creatinine, Ser: 0.61 mg/dL (ref 0.57–1.00)
Globulin, Total: 2.4 g/dL (ref 1.5–4.5)
Glucose: 80 mg/dL (ref 70–99)
Potassium: 4 mmol/L (ref 3.5–5.2)
Sodium: 134 mmol/L (ref 134–144)
Total Protein: 6.4 g/dL (ref 6.0–8.5)
eGFR: 113 mL/min/1.73

## 2024-08-26 LAB — CBC WITH DIFFERENTIAL/PLATELET
Basophils Absolute: 0.1 x10E3/uL (ref 0.0–0.2)
Basos: 1 %
EOS (ABSOLUTE): 0.1 x10E3/uL (ref 0.0–0.4)
Eos: 1 %
Hematocrit: 35.1 % (ref 34.0–46.6)
Hemoglobin: 11.5 g/dL (ref 11.1–15.9)
Immature Grans (Abs): 0 x10E3/uL (ref 0.0–0.1)
Immature Granulocytes: 0 %
Lymphocytes Absolute: 3.5 x10E3/uL — ABNORMAL HIGH (ref 0.7–3.1)
Lymphs: 39 %
MCH: 31.1 pg (ref 26.6–33.0)
MCHC: 32.8 g/dL (ref 31.5–35.7)
MCV: 95 fL (ref 79–97)
Monocytes Absolute: 0.5 x10E3/uL (ref 0.1–0.9)
Monocytes: 6 %
Neutrophils Absolute: 4.8 x10E3/uL (ref 1.4–7.0)
Neutrophils: 53 %
Platelets: 295 x10E3/uL (ref 150–450)
RBC: 3.7 x10E6/uL — ABNORMAL LOW (ref 3.77–5.28)
RDW: 11.9 % (ref 11.7–15.4)
WBC: 8.9 x10E3/uL (ref 3.4–10.8)

## 2024-08-26 LAB — TSH: TSH: 1.63 u[IU]/mL (ref 0.450–4.500)

## 2024-08-28 ENCOUNTER — Ambulatory Visit: Payer: Self-pay | Admitting: Nurse Practitioner

## 2025-02-24 ENCOUNTER — Ambulatory Visit: Admitting: Nurse Practitioner
